# Patient Record
Sex: Female | Born: 1940
Health system: Southern US, Community
[De-identification: ages and names within clinical notes are randomized; demographics above are authoritative.]

## PROBLEM LIST (undated history)

## (undated) DIAGNOSIS — Z8249 Family history of ischemic heart disease and other diseases of the circulatory system: Secondary | ICD-10-CM

## (undated) DIAGNOSIS — R350 Frequency of micturition: Secondary | ICD-10-CM

## (undated) DIAGNOSIS — D649 Anemia, unspecified: Secondary | ICD-10-CM

## (undated) DIAGNOSIS — I7300 Raynaud's syndrome without gangrene: Secondary | ICD-10-CM

## (undated) DIAGNOSIS — R232 Flushing: Secondary | ICD-10-CM

## (undated) DIAGNOSIS — I340 Nonrheumatic mitral (valve) insufficiency: Secondary | ICD-10-CM

## (undated) DIAGNOSIS — Z923 Personal history of irradiation: Secondary | ICD-10-CM

## (undated) DIAGNOSIS — R634 Abnormal weight loss: Secondary | ICD-10-CM

## (undated) DIAGNOSIS — E785 Hyperlipidemia, unspecified: Secondary | ICD-10-CM

## (undated) DIAGNOSIS — N1831 Chronic kidney disease, stage 3a: Secondary | ICD-10-CM

## (undated) DIAGNOSIS — C50919 Malignant neoplasm of unspecified site of unspecified female breast: Secondary | ICD-10-CM

## (undated) DIAGNOSIS — Z460 Encounter for fitting and adjustment of spectacles and contact lenses: Secondary | ICD-10-CM

## (undated) DIAGNOSIS — Z853 Personal history of malignant neoplasm of breast: Principal | ICD-10-CM

## (undated) DIAGNOSIS — I2510 Atherosclerotic heart disease of native coronary artery without angina pectoris: Secondary | ICD-10-CM

## (undated) DIAGNOSIS — Z6827 Body mass index (BMI) 27.0-27.9, adult: Secondary | ICD-10-CM

## (undated) DIAGNOSIS — Z780 Asymptomatic menopausal state: Secondary | ICD-10-CM

## (undated) DIAGNOSIS — R002 Palpitations: Secondary | ICD-10-CM

## (undated) DIAGNOSIS — I10 Essential (primary) hypertension: Secondary | ICD-10-CM

## (undated) DIAGNOSIS — M1990 Unspecified osteoarthritis, unspecified site: Secondary | ICD-10-CM

## (undated) DIAGNOSIS — Z9221 Personal history of antineoplastic chemotherapy: Secondary | ICD-10-CM

## (undated) DIAGNOSIS — D571 Sickle-cell disease without crisis: Secondary | ICD-10-CM

## (undated) DIAGNOSIS — M79673 Pain in unspecified foot: Secondary | ICD-10-CM

## (undated) DIAGNOSIS — E049 Nontoxic goiter, unspecified: Secondary | ICD-10-CM

## (undated) DIAGNOSIS — Z972 Presence of dental prosthetic device (complete) (partial): Secondary | ICD-10-CM

## (undated) DIAGNOSIS — N189 Chronic kidney disease, unspecified: Secondary | ICD-10-CM

## (undated) DIAGNOSIS — I499 Cardiac arrhythmia, unspecified: Secondary | ICD-10-CM

## (undated) DIAGNOSIS — C801 Malignant (primary) neoplasm, unspecified: Secondary | ICD-10-CM

## (undated) DIAGNOSIS — R109 Unspecified abdominal pain: Secondary | ICD-10-CM

## (undated) HISTORY — DX: Malignant (primary) neoplasm, unspecified: C80.1

## (undated) HISTORY — DX: Chronic kidney disease, unspecified: N18.9

## (undated) HISTORY — DX: Family history of ischemic heart disease and other diseases of the circulatory system: Z82.49

## (undated) HISTORY — DX: Atherosclerotic heart disease of native coronary artery without angina pectoris: I25.10

## (undated) HISTORY — DX: Flushing: R23.2

## (undated) HISTORY — DX: Asymptomatic menopausal state: Z78.0

## (undated) HISTORY — DX: Body mass index (BMI) 27.0-27.9, adult: Z68.27

## (undated) HISTORY — DX: Chronic kidney disease, stage 3a: N18.31

## (undated) HISTORY — DX: Personal history of malignant neoplasm of breast: Z85.3

## (undated) HISTORY — DX: Unspecified osteoarthritis, unspecified site: M19.90

## (undated) HISTORY — DX: Personal history of irradiation: Z92.3

## (undated) HISTORY — DX: Sickle-cell disease without crisis: D57.1

## (undated) HISTORY — DX: Personal history of antineoplastic chemotherapy: Z92.21

## (undated) HISTORY — DX: Abnormal weight loss: R63.4

## (undated) HISTORY — DX: Hyperlipidemia, unspecified: E78.5

## (undated) HISTORY — PX: CHOLECYSTECTOMY: SHX55

## (undated) HISTORY — DX: Malignant neoplasm of unspecified site of unspecified female breast: C50.919

## (undated) HISTORY — DX: Cardiac arrhythmia, unspecified: I49.9

## (undated) HISTORY — DX: Palpitations: R00.2

## (undated) HISTORY — PX: TONSILLECTOMY: SUR1361

## (undated) HISTORY — DX: Pain in unspecified foot: M79.673

## (undated) HISTORY — DX: Nontoxic goiter, unspecified: E04.9

## (undated) HISTORY — DX: Essential (primary) hypertension: I10

## (undated) HISTORY — DX: Nonrheumatic mitral (valve) insufficiency: I34.0

## (undated) HISTORY — DX: Unspecified abdominal pain: R10.9

## (undated) HISTORY — DX: Anemia, unspecified: D64.9

## (undated) HISTORY — PX: COLONOSCOPY: SHX174

---

## 1998-05-08 ENCOUNTER — Other Ambulatory Visit: Admission: RE | Admit: 1998-05-08 | Discharge: 1998-05-08 | Payer: Self-pay | Admitting: Obstetrics & Gynecology

## 1999-05-16 ENCOUNTER — Other Ambulatory Visit: Admission: RE | Admit: 1999-05-16 | Discharge: 1999-05-16 | Payer: Self-pay | Admitting: Obstetrics & Gynecology

## 1999-07-25 ENCOUNTER — Other Ambulatory Visit: Admission: RE | Admit: 1999-07-25 | Discharge: 1999-07-25 | Payer: Self-pay | Admitting: Endocrinology

## 1999-09-17 DIAGNOSIS — Z923 Personal history of irradiation: Secondary | ICD-10-CM

## 1999-09-17 DIAGNOSIS — Z9221 Personal history of antineoplastic chemotherapy: Secondary | ICD-10-CM

## 1999-09-17 HISTORY — DX: Personal history of irradiation: Z92.3

## 1999-09-17 HISTORY — DX: Personal history of antineoplastic chemotherapy: Z92.21

## 1999-09-17 HISTORY — PX: BREAST LUMPECTOMY: SHX2

## 2000-01-11 ENCOUNTER — Ambulatory Visit (HOSPITAL_COMMUNITY): Admission: RE | Admit: 2000-01-11 | Discharge: 2000-01-11 | Payer: Self-pay | Admitting: Endocrinology

## 2000-01-11 ENCOUNTER — Encounter: Payer: Self-pay | Admitting: Endocrinology

## 2000-05-30 ENCOUNTER — Other Ambulatory Visit: Admission: RE | Admit: 2000-05-30 | Discharge: 2000-05-30 | Payer: Self-pay | Admitting: Obstetrics & Gynecology

## 2000-07-16 HISTORY — PX: BREAST SURGERY: SHX581

## 2001-06-24 ENCOUNTER — Other Ambulatory Visit: Admission: RE | Admit: 2001-06-24 | Discharge: 2001-06-24 | Payer: Self-pay | Admitting: Obstetrics & Gynecology

## 2005-07-26 ENCOUNTER — Other Ambulatory Visit: Admission: RE | Admit: 2005-07-26 | Discharge: 2005-07-26 | Payer: Self-pay | Admitting: Obstetrics & Gynecology

## 2005-11-10 ENCOUNTER — Emergency Department (HOSPITAL_COMMUNITY): Admission: EM | Admit: 2005-11-10 | Discharge: 2005-11-10 | Payer: Self-pay | Admitting: Emergency Medicine

## 2007-09-29 ENCOUNTER — Ambulatory Visit (HOSPITAL_COMMUNITY): Admission: RE | Admit: 2007-09-29 | Discharge: 2007-09-29 | Payer: Self-pay | Admitting: Internal Medicine

## 2009-11-14 ENCOUNTER — Encounter: Admission: RE | Admit: 2009-11-14 | Discharge: 2009-11-14 | Payer: Self-pay | Admitting: General Surgery

## 2009-12-11 ENCOUNTER — Ambulatory Visit (HOSPITAL_COMMUNITY): Admission: RE | Admit: 2009-12-11 | Discharge: 2009-12-12 | Payer: Self-pay | Admitting: General Surgery

## 2009-12-11 ENCOUNTER — Encounter (INDEPENDENT_AMBULATORY_CARE_PROVIDER_SITE_OTHER): Payer: Self-pay | Admitting: General Surgery

## 2009-12-11 HISTORY — PX: MASTECTOMY: SHX3

## 2009-12-15 ENCOUNTER — Ambulatory Visit: Payer: Self-pay | Admitting: Oncology

## 2010-01-17 ENCOUNTER — Encounter: Admission: RE | Admit: 2010-01-17 | Discharge: 2010-02-20 | Payer: Self-pay | Admitting: General Surgery

## 2010-06-14 ENCOUNTER — Ambulatory Visit: Payer: Self-pay | Admitting: Oncology

## 2010-06-18 LAB — CBC WITH DIFFERENTIAL/PLATELET
BASO%: 0.3 % (ref 0.0–2.0)
Basophils Absolute: 0 10*3/uL (ref 0.0–0.1)
EOS%: 1.6 % (ref 0.0–7.0)
Eosinophils Absolute: 0.1 10*3/uL (ref 0.0–0.5)
HCT: 37.9 % (ref 34.8–46.6)
HGB: 12.9 g/dL (ref 11.6–15.9)
LYMPH%: 25.2 % (ref 14.0–49.7)
MCH: 28.4 pg (ref 25.1–34.0)
MCHC: 34.1 g/dL (ref 31.5–36.0)
MCV: 83.4 fL (ref 79.5–101.0)
MONO#: 0.5 10*3/uL (ref 0.1–0.9)
MONO%: 6.7 % (ref 0.0–14.0)
NEUT#: 4.7 10*3/uL (ref 1.5–6.5)
NEUT%: 66.2 % (ref 38.4–76.8)
Platelets: 222 10*3/uL (ref 145–400)
RBC: 4.55 10*6/uL (ref 3.70–5.45)
RDW: 15 % — ABNORMAL HIGH (ref 11.2–14.5)
WBC: 7 10*3/uL (ref 3.9–10.3)
lymph#: 1.8 10*3/uL (ref 0.9–3.3)

## 2010-06-18 LAB — COMPREHENSIVE METABOLIC PANEL
ALT: 15 U/L (ref 0–35)
AST: 18 U/L (ref 0–37)
Albumin: 4.7 g/dL (ref 3.5–5.2)
Alkaline Phosphatase: 75 U/L (ref 39–117)
BUN: 15 mg/dL (ref 6–23)
CO2: 29 mEq/L (ref 19–32)
Calcium: 10 mg/dL (ref 8.4–10.5)
Chloride: 107 mEq/L (ref 96–112)
Creatinine, Ser: 1.08 mg/dL (ref 0.40–1.20)
Glucose, Bld: 83 mg/dL (ref 70–99)
Potassium: 4 mEq/L (ref 3.5–5.3)
Sodium: 145 mEq/L (ref 135–145)
Total Bilirubin: 0.5 mg/dL (ref 0.3–1.2)
Total Protein: 7.1 g/dL (ref 6.0–8.3)

## 2010-06-18 LAB — LACTATE DEHYDROGENASE: LDH: 174 U/L (ref 94–250)

## 2010-06-18 LAB — CANCER ANTIGEN 27.29: CA 27.29: 43 U/mL — ABNORMAL HIGH (ref 0–39)

## 2010-10-06 ENCOUNTER — Other Ambulatory Visit: Payer: Self-pay | Admitting: Oncology

## 2010-10-06 DIAGNOSIS — Z0000 Encounter for general adult medical examination without abnormal findings: Secondary | ICD-10-CM

## 2010-10-07 ENCOUNTER — Encounter: Payer: Self-pay | Admitting: Internal Medicine

## 2010-10-17 ENCOUNTER — Ambulatory Visit
Admission: RE | Admit: 2010-10-17 | Discharge: 2010-10-17 | Disposition: A | Discharge status: Home / Self Care | Payer: Medicare Other | Source: Ambulatory Visit | Attending: Oncology | Admitting: Oncology

## 2010-10-17 DIAGNOSIS — Z0000 Encounter for general adult medical examination without abnormal findings: Secondary | ICD-10-CM

## 2010-12-10 LAB — CBC
Platelets: 232 10*3/uL (ref 150–400)
RDW: 13.9 % (ref 11.5–15.5)
WBC: 5.9 10*3/uL (ref 4.0–10.5)

## 2010-12-10 LAB — COMPREHENSIVE METABOLIC PANEL
ALT: 20 U/L (ref 0–35)
AST: 24 U/L (ref 0–37)
Albumin: 4.3 g/dL (ref 3.5–5.2)
Alkaline Phosphatase: 72 U/L (ref 39–117)
BUN: 8 mg/dL (ref 6–23)
Chloride: 104 mEq/L (ref 96–112)
GFR calc Af Amer: >60 mL/min (ref >60)
Potassium: 3.5 mEq/L (ref 3.5–5.1)
Sodium: 142 mEq/L (ref 135–145)
Total Bilirubin: 0.4 mg/dL (ref 0.3–1.2)
Total Protein: 7.5 g/dL (ref 6.0–8.3)

## 2010-12-10 LAB — DIFFERENTIAL
Basophils Absolute: 0 10*3/uL (ref 0.0–0.1)
Basophils Relative: 0 % (ref 0–1)
Eosinophils Absolute: 0.1 10*3/uL (ref 0.0–0.7)
Monocytes Absolute: 0.3 10*3/uL (ref 0.1–1.0)
Monocytes Relative: 5 % (ref 3–12)
Neutrophils Relative %: 62 % (ref 43–77)

## 2010-12-10 LAB — URINALYSIS, ROUTINE W REFLEX MICROSCOPIC
Glucose, UA: NEGATIVE mg/dL
Hgb urine dipstick: NEGATIVE
Ketones, ur: NEGATIVE mg/dL
Protein, ur: NEGATIVE mg/dL

## 2010-12-10 LAB — GLUCOSE, CAPILLARY
Glucose-Capillary: 144 mg/dL — ABNORMAL HIGH (ref 70–99)
Glucose-Capillary: 146 mg/dL — ABNORMAL HIGH (ref 70–99)

## 2010-12-10 LAB — CANCER ANTIGEN 27.29: CA 27.29: 43 U/mL — ABNORMAL HIGH (ref 0–39)

## 2011-01-21 ENCOUNTER — Encounter (HOSPITAL_BASED_OUTPATIENT_CLINIC_OR_DEPARTMENT_OTHER): Payer: Medicare Other | Admitting: Oncology

## 2011-01-21 ENCOUNTER — Other Ambulatory Visit: Payer: Self-pay | Admitting: Oncology

## 2011-01-21 DIAGNOSIS — Z170 Estrogen receptor positive status [ER+]: Secondary | ICD-10-CM

## 2011-01-21 DIAGNOSIS — C50419 Malignant neoplasm of upper-outer quadrant of unspecified female breast: Secondary | ICD-10-CM

## 2011-01-21 DIAGNOSIS — D0590 Unspecified type of carcinoma in situ of unspecified breast: Secondary | ICD-10-CM

## 2011-01-21 DIAGNOSIS — Z171 Estrogen receptor negative status [ER-]: Secondary | ICD-10-CM

## 2011-01-21 LAB — CBC WITH DIFFERENTIAL/PLATELET
Basophils Absolute: 0 10*3/uL (ref 0.0–0.1)
EOS%: 1.6 % (ref 0.0–7.0)
Eosinophils Absolute: 0.1 10*3/uL (ref 0.0–0.5)
HGB: 12.3 g/dL (ref 11.6–15.9)
MONO#: 0.4 10*3/uL (ref 0.1–0.9)
NEUT#: 4.6 10*3/uL (ref 1.5–6.5)
RDW: 13.9 % (ref 11.2–14.5)
WBC: 7.4 10*3/uL (ref 3.9–10.3)
lymph#: 2.3 10*3/uL (ref 0.9–3.3)

## 2011-01-22 ENCOUNTER — Encounter (INDEPENDENT_AMBULATORY_CARE_PROVIDER_SITE_OTHER): Payer: Self-pay | Admitting: General Surgery

## 2011-01-22 LAB — COMPREHENSIVE METABOLIC PANEL
ALT: 13 U/L (ref 0–35)
AST: 22 U/L (ref 0–37)
BUN: 12 mg/dL (ref 6–23)
Calcium: 9.7 mg/dL (ref 8.4–10.5)
Chloride: 107 mEq/L (ref 96–112)
Creatinine, Ser: 1.2 mg/dL (ref 0.40–1.20)
Total Bilirubin: 0.5 mg/dL (ref 0.3–1.2)

## 2011-01-22 LAB — LACTATE DEHYDROGENASE: LDH: 171 U/L (ref 94–250)

## 2011-01-22 LAB — VITAMIN D 25 HYDROXY (VIT D DEFICIENCY, FRACTURES): Vit D, 25-Hydroxy: 20 ng/mL — ABNORMAL LOW (ref 30–89)

## 2011-08-26 ENCOUNTER — Telehealth: Payer: Self-pay | Admitting: *Deleted

## 2011-08-26 NOTE — Telephone Encounter (Signed)
patient requested that I mail out her 04-2012 calendar

## 2011-09-19 ENCOUNTER — Other Ambulatory Visit (INDEPENDENT_AMBULATORY_CARE_PROVIDER_SITE_OTHER): Payer: Self-pay | Admitting: General Surgery

## 2011-09-19 DIAGNOSIS — Z853 Personal history of malignant neoplasm of breast: Secondary | ICD-10-CM

## 2011-09-19 DIAGNOSIS — Z9011 Acquired absence of right breast and nipple: Secondary | ICD-10-CM

## 2011-10-22 ENCOUNTER — Ambulatory Visit
Admission: RE | Admit: 2011-10-22 | Discharge: 2011-10-22 | Disposition: A | Discharge status: Home / Self Care | Payer: Medicare Other | Source: Ambulatory Visit | Attending: General Surgery | Admitting: General Surgery

## 2011-10-22 DIAGNOSIS — Z1231 Encounter for screening mammogram for malignant neoplasm of breast: Secondary | ICD-10-CM | POA: Diagnosis not present

## 2011-10-22 DIAGNOSIS — Z853 Personal history of malignant neoplasm of breast: Secondary | ICD-10-CM

## 2011-10-22 DIAGNOSIS — Z9011 Acquired absence of right breast and nipple: Secondary | ICD-10-CM

## 2011-10-28 DIAGNOSIS — E7800 Pure hypercholesterolemia, unspecified: Secondary | ICD-10-CM | POA: Diagnosis not present

## 2011-10-28 DIAGNOSIS — R350 Frequency of micturition: Secondary | ICD-10-CM | POA: Diagnosis not present

## 2011-10-28 DIAGNOSIS — I10 Essential (primary) hypertension: Secondary | ICD-10-CM | POA: Diagnosis not present

## 2011-10-28 DIAGNOSIS — Z79899 Other long term (current) drug therapy: Secondary | ICD-10-CM | POA: Diagnosis not present

## 2011-11-05 ENCOUNTER — Encounter (INDEPENDENT_AMBULATORY_CARE_PROVIDER_SITE_OTHER): Payer: Self-pay | Admitting: General Surgery

## 2011-11-05 ENCOUNTER — Ambulatory Visit (INDEPENDENT_AMBULATORY_CARE_PROVIDER_SITE_OTHER): Payer: Medicare Other | Admitting: General Surgery

## 2011-11-05 VITALS — BP 124/76 | HR 68 | Temp 97.9°F | Resp 16 | Ht 66.0 in | Wt 170.8 lb

## 2011-11-05 DIAGNOSIS — Z853 Personal history of malignant neoplasm of breast: Secondary | ICD-10-CM

## 2011-11-05 NOTE — Progress Notes (Signed)
Patient ID: Catherine Leon, female   DOB: 1941/01/03, 71 y.o.   MRN: [id number]  Chief Complaint  Patient presents with  . Breast Cancer Long Term Follow Up    MGM 10/22/11 at breast center    HPI Catherine Leon is a 71 y.o. female.  She returns for long-term followup regarding her right breast cancer.  This patient underwent right partial mastectomy and axillary lymph node dissection in 2001 for invasive cancer which was receptor positive, pathologic stage T2 N1.  She developed a recurrence in the right breast. On December 11, 2009 she underwent right total mastectomy. She had a stage Tmic, Nx, receptor-negative, HER-2-negative tumor. She had a port placed. Chemotherapy extravasated into the soft tissues in the left infraclavicular area. This required removal of the port and plastic surgical revision.  She has considered reconstructive options of the right breast but has decided not to do that.  She has not noticed any new problems in the right mastectomy wound or the left breast. She still has some numbness in the right axilla.  Left breast mammograms performed September 19, 2011 but normal, category one. HPI  Past Medical History  Diagnosis Date  . Diabetes mellitus   . Cancer   . Hyperlipidemia   . Hypertension   . Constipation     Past Surgical History  Procedure Date  . Mastectomy 12/11/2009    RIGHT BREAST  . Breast surgery 07/16/2000    lumpectomy  . Cholecystectomy 1995 - approximate    Family History  Problem Relation Age of Onset  . Heart disease Mother   . Kidney disease Father   . Diabetes Father   . Cancer Paternal Aunt     colon  . Cancer Paternal Aunt     breast    Social History History  Substance Use Topics  . Smoking status: Former Smoker    Quit date: 11/04/1982  . Smokeless tobacco: Never Used  . Alcohol Use: Yes     WINE. AVERAGE 1 GLASS A MONTH    No Known Allergies  Current Outpatient Prescriptions  Medication Sig Dispense Refill  .  losartan-hydrochlorothiazide (HYZAAR) 100-25 MG per tablet Daily.      [name] oxybutynin (DITROPAN) 5 MG tablet Take 5 mg by mouth 2 (two) times daily.      [name] glipizide-metformin (METAGLIP) 2.5-250 MG per tablet Take 1 tablet by mouth 2 (two) times daily before a meal.        . rosuvastatin (CRESTOR) 5 MG tablet Take 5 mg by mouth daily.          Review of Systems Review of Systems  Constitutional: Negative for fever, chills and unexpected weight change.  HENT: Negative for hearing loss, congestion, sore throat, trouble swallowing and voice change.   Eyes: Negative for visual disturbance.  Respiratory: Negative for cough and wheezing.   Cardiovascular: Negative for chest pain, palpitations and leg swelling.  Gastrointestinal: Negative for nausea, vomiting, abdominal pain, diarrhea, constipation, blood in stool, abdominal distention and anal bleeding.  Genitourinary: Negative for hematuria, vaginal bleeding and difficulty urinating.  Musculoskeletal: Negative for arthralgias.  Skin: Negative for rash and wound.  Neurological: Positive for numbness. Negative for seizures, syncope and headaches.  Hematological: Negative for adenopathy. Does not bruise/bleed easily.  Psychiatric/Behavioral: Negative for confusion.    Blood pressure 124/76, pulse 68, temperature 97.9 F (36.6 C), temperature source Temporal, resp. rate 16, height 5\' 6"  (1.676 m), weight 170 lb 12.8 oz (77.474 kg).  Physical Exam Physical  Exam  Constitutional: She is oriented to person, place, and time. She appears well-developed and well-nourished. No distress.  Neck: Normal range of motion. Neck supple. No JVD present. No tracheal deviation present. No thyromegaly present.  Cardiovascular: Normal rate, regular rhythm and normal heart sounds.   No murmur heard. Pulmonary/Chest: Breath sounds normal. No respiratory distress. She has no wheezes. She has no rales. She exhibits no tenderness.    Musculoskeletal: Normal range of  motion. She exhibits no edema.  Lymphadenopathy:    She has no cervical adenopathy.  Neurological: She is alert and oriented to person, place, and time. Coordination normal.  Skin: Skin is warm and dry. No rash noted. She is not diaphoretic. No erythema. No pallor.  Psychiatric: She has a normal mood and affect. Her behavior is normal. Judgment and thought content normal.    Data Reviewed I have reviewed her mammograms and old records.  Assessment    Recurrent cancer, right breast, upper outer quadrant, pathologic stage TM I SE, in excess, receptor-negative, HER-2 negative. No evidence of recurrence 2 years following salvage right mastectomy.  Past history right partial mastectomy and axillary node dissection in 2001 for ER positive, T2 N1 tumor.  Hypertension  Diabetes  Multi-nodular goiter  S/p porta a cath placement and removal, remote.    Plan    She will see Dr. Drue Second in August of this year.  Return to see me in one year after she gets her annual left breast mammogram.       Angelia Mould. Derrell Lolling, M.D., [facility] General and Minimally invasive Surgery Breast and Colorectal Surgery Office:   [phone] Pager:   [phone]  11/05/2011, 11:47 AM

## 2011-11-05 NOTE — Patient Instructions (Signed)
Your physical exam today reveals no evidence of recurrent cancer on either side. Your left breast mammograms are normal. I think that you were doing well.  Keep your annual appointment with Dr. Drue Second in August.  Return to see me in one year for future annual left breast mammogram.

## 2011-11-13 DIAGNOSIS — E119 Type 2 diabetes mellitus without complications: Secondary | ICD-10-CM | POA: Diagnosis not present

## 2011-11-13 DIAGNOSIS — E049 Nontoxic goiter, unspecified: Secondary | ICD-10-CM | POA: Diagnosis not present

## 2011-11-13 DIAGNOSIS — I10 Essential (primary) hypertension: Secondary | ICD-10-CM | POA: Diagnosis not present

## 2011-11-14 ENCOUNTER — Other Ambulatory Visit: Payer: Self-pay | Admitting: Internal Medicine

## 2011-11-14 DIAGNOSIS — E049 Nontoxic goiter, unspecified: Secondary | ICD-10-CM

## 2011-12-10 ENCOUNTER — Telehealth: Payer: Self-pay | Admitting: Oncology

## 2011-12-10 NOTE — Telephone Encounter (Signed)
S/w the regarding her r/s 04/22/2012 appt to 05/04/2012 due to the md is on pal

## 2011-12-17 ENCOUNTER — Other Ambulatory Visit: Payer: Medicare Other

## 2012-03-10 DIAGNOSIS — E119 Type 2 diabetes mellitus without complications: Secondary | ICD-10-CM | POA: Diagnosis not present

## 2012-03-12 DIAGNOSIS — I10 Essential (primary) hypertension: Secondary | ICD-10-CM | POA: Diagnosis not present

## 2012-03-12 DIAGNOSIS — E119 Type 2 diabetes mellitus without complications: Secondary | ICD-10-CM | POA: Diagnosis not present

## 2012-04-22 ENCOUNTER — Ambulatory Visit: Payer: Medicare Other | Admitting: Oncology

## 2012-04-22 ENCOUNTER — Other Ambulatory Visit: Payer: Medicare Other | Admitting: Lab

## 2012-04-27 ENCOUNTER — Other Ambulatory Visit: Payer: Medicare Other | Admitting: Lab

## 2012-05-04 ENCOUNTER — Other Ambulatory Visit (HOSPITAL_BASED_OUTPATIENT_CLINIC_OR_DEPARTMENT_OTHER): Payer: Medicare Other | Admitting: Lab

## 2012-05-04 ENCOUNTER — Ambulatory Visit (HOSPITAL_BASED_OUTPATIENT_CLINIC_OR_DEPARTMENT_OTHER): Payer: Medicare Other | Admitting: Oncology

## 2012-05-04 ENCOUNTER — Telehealth: Payer: Self-pay | Admitting: *Deleted

## 2012-05-04 ENCOUNTER — Encounter: Payer: Self-pay | Admitting: Oncology

## 2012-05-04 VITALS — BP 130/72 | HR 80 | Temp 98.0°F | Resp 18 | Ht 66.0 in | Wt 167.4 lb

## 2012-05-04 DIAGNOSIS — Z171 Estrogen receptor negative status [ER-]: Secondary | ICD-10-CM

## 2012-05-04 DIAGNOSIS — D0590 Unspecified type of carcinoma in situ of unspecified breast: Secondary | ICD-10-CM | POA: Diagnosis not present

## 2012-05-04 DIAGNOSIS — C50919 Malignant neoplasm of unspecified site of unspecified female breast: Secondary | ICD-10-CM

## 2012-05-04 DIAGNOSIS — Z853 Personal history of malignant neoplasm of breast: Secondary | ICD-10-CM | POA: Insufficient documentation

## 2012-05-04 DIAGNOSIS — Z170 Estrogen receptor positive status [ER+]: Secondary | ICD-10-CM | POA: Diagnosis not present

## 2012-05-04 DIAGNOSIS — C50419 Malignant neoplasm of upper-outer quadrant of unspecified female breast: Secondary | ICD-10-CM | POA: Diagnosis not present

## 2012-05-04 HISTORY — DX: Personal history of malignant neoplasm of breast: Z85.3

## 2012-05-04 LAB — CBC WITH DIFFERENTIAL/PLATELET
BASO%: 0.2 % (ref 0.0–2.0)
Eosinophils Absolute: 0.1 10*3/uL (ref 0.0–0.5)
HCT: 36.1 % (ref 34.8–46.6)
LYMPH%: 24.9 % (ref 14.0–49.7)
MCHC: 33.6 g/dL (ref 31.5–36.0)
MONO#: 0.4 10*3/uL (ref 0.1–0.9)
NEUT#: 4.9 10*3/uL (ref 1.5–6.5)
Platelets: 212 10*3/uL (ref 145–400)
RBC: 4.29 10*6/uL (ref 3.70–5.45)
WBC: 7.2 10*3/uL (ref 3.9–10.3)
lymph#: 1.8 10*3/uL (ref 0.9–3.3)

## 2012-05-04 LAB — COMPREHENSIVE METABOLIC PANEL
ALT: 13 U/L (ref 0–35)
Albumin: 4.4 g/dL (ref 3.5–5.2)
CO2: 30 mEq/L (ref 19–32)
Calcium: 9.6 mg/dL (ref 8.4–10.5)
Chloride: 105 mEq/L (ref 96–112)
Glucose, Bld: 236 mg/dL — ABNORMAL HIGH (ref 70–99)
Potassium: 3.9 mEq/L (ref 3.5–5.3)
Sodium: 143 mEq/L (ref 135–145)
Total Protein: 7 g/dL (ref 6.0–8.3)

## 2012-05-04 NOTE — Patient Instructions (Addendum)
Peridin C over the counter for hot flashes

## 2012-05-04 NOTE — Progress Notes (Signed)
OFFICE PROGRESS NOTE  CC  Londell Moh, MD [address] [place] [address]  DIAGNOSIS: 71 year old female with ductal carcinoma in situ with less than 0.2 cm component of microinvasive ductal carcinoma of the right breast status post right mastectomy on 12/11/2009. Tumor was ER negative PR negative HER-2/neu negative.  PRIOR THERAPY:  #1 patient had stage IIB invasive ductal carcinoma of the right breast that was ER PR positive HER-2/neu negative. At that time she underwent a partial mastectomy with axillary lymph node dissection followed by 6 cycles of CMF followed by radiation. She then received 10 years of adjuvant antiestrogen therapy consisting of tamoxifen followed by Arimidex.  #2  In 12/11/2009 patient was found to have a DCIS of the right breast with a 0.2 cm microinvasive ductal carcinoma this was triple negative low grade. She underwent a mastectomy and has been on observation now.  CURRENT THERAPY:Observation  INTERVAL HISTORY: Catherine Leon 71 y.o. female returns for Followup visit at one year overall she is doing well she is without any complaints she denies any nausea vomiting fevers chills night sweats. Her mammograms are up to date she had her last mammogram in January 2013. Remainder of the 10 point review of systems is negative.  MEDICAL HISTORY: Past Medical History  Diagnosis Date  . Diabetes mellitus   . Cancer   . Hyperlipidemia   . Hypertension   . Constipation   . Hx of breast cancer 05/04/2012    ALLERGIES:   has no known allergies.  MEDICATIONS:  Current Outpatient Prescriptions  Medication Sig Dispense Refill  . glipizide-metformin (METAGLIP) 2.5-250 MG per tablet Take 1 tablet by mouth 2 (two) times daily before a meal.       . losartan-hydrochlorothiazide (HYZAAR) 100-25 MG per tablet Daily.      [name] oxybutynin (DITROPAN) 5 MG tablet Take 5 mg by mouth 2 (two) times daily.      . rosuvastatin (CRESTOR) 5 MG tablet Take  5 mg by mouth daily.          SURGICAL HISTORY:  Past Surgical History  Procedure Date  . Mastectomy 12/11/2009    RIGHT BREAST  . Breast surgery 07/16/2000    lumpectomy  . Cholecystectomy 1995 - approximate    REVIEW OF SYSTEMS:  Pertinent items are noted in HPI.   PHYSICAL EXAMINATION: General appearance: alert and cooperative Lymph nodes: Cervical, supraclavicular, and axillary nodes normal. Resp: clear to auscultation bilaterally Back: symmetric, no curvature. ROM normal. No CVA tenderness. Cardio: regular rate and rhythm, S1, S2 normal, no murmur, click, rub or gallop GI: soft, non-tender; bowel sounds normal; no masses,  no organomegaly Extremities: extremities normal, atraumatic, no cyanosis or edema Neurologic: Grossly normal Left breast no masses nipple discharge or skin changes right mastectomy incision scar is well-healed no evidence of local recurrence.  ECOG PERFORMANCE STATUS: 0 - Asymptomatic  Blood pressure 130/72, pulse 80, temperature 98 F (36.7 C), temperature source Oral, resp. rate 18, height 5\' 6"  (1.676 m), weight 167 lb 6.4 oz (75.932 kg).  LABORATORY DATA: Lab Results  Component Value Date   WBC 7.2 05/04/2012   HGB 12.1 05/04/2012   HCT 36.1 05/04/2012   MCV 84.3 05/04/2012   PLT 212 05/04/2012      Chemistry         RADIOGRAPHIC STUDIES:  No results found.  ASSESSMENT: 71 year old female with prior history of stage IIB right breast cancer treated with chemotherapy and antiestrogen therapy. Subsequently patient developed a  triple-negative breast cancer in the ipsilateral breast she therefore underwent a mastectomy on 12/11/2009. She is without any evidence of local recurrence or any contralateral breast cancer. She is followed very closely by Dr. Claud Kelp.   PLAN: Patient will be seen by me on as needed basis however I did set her up to see me in about 2 years time. She is seeing Dr. Derrell Lolling on a yearly basis as well.   All questions  were answered. The patient knows to call the clinic with any problems, questions or concerns. We can certainly see the patient much sooner if necessary.  I spent 20 minutes counseling the patient face to face. The total time spent in the appointment was 30 minutes.    Drue Second, MD Medical/Oncology [facility] [phone] (beeper) [phone] (Office)  05/04/2012, 2:49 PM

## 2012-05-04 NOTE — Telephone Encounter (Signed)
Per orders physicians schedule is not open for 2015 yet printed out orders

## 2012-07-09 DIAGNOSIS — E119 Type 2 diabetes mellitus without complications: Secondary | ICD-10-CM | POA: Diagnosis not present

## 2012-07-09 DIAGNOSIS — Z0000 Encounter for general adult medical examination without abnormal findings: Secondary | ICD-10-CM | POA: Diagnosis not present

## 2012-07-09 DIAGNOSIS — I10 Essential (primary) hypertension: Secondary | ICD-10-CM | POA: Diagnosis not present

## 2012-07-14 ENCOUNTER — Telehealth (INDEPENDENT_AMBULATORY_CARE_PROVIDER_SITE_OTHER): Payer: Self-pay | Admitting: General Surgery

## 2012-07-14 NOTE — Telephone Encounter (Signed)
Pt calling to request a Rx for replacing her prosthesis at [facility] [place].  Script issued for an "athletic/ leisure breast form" and will FAX to [phone] when signed by Dr. Derrell Lolling.

## 2012-08-31 DIAGNOSIS — B029 Zoster without complications: Secondary | ICD-10-CM | POA: Diagnosis not present

## 2012-10-01 ENCOUNTER — Encounter (INDEPENDENT_AMBULATORY_CARE_PROVIDER_SITE_OTHER): Payer: Self-pay | Admitting: General Surgery

## 2012-10-01 ENCOUNTER — Other Ambulatory Visit (INDEPENDENT_AMBULATORY_CARE_PROVIDER_SITE_OTHER): Payer: Self-pay | Admitting: General Surgery

## 2012-10-01 DIAGNOSIS — Z9011 Acquired absence of right breast and nipple: Secondary | ICD-10-CM

## 2012-10-01 DIAGNOSIS — Z1231 Encounter for screening mammogram for malignant neoplasm of breast: Secondary | ICD-10-CM

## 2012-10-01 NOTE — Progress Notes (Signed)
Faxed on 09/30/12 signed order by Dr. Derrell Lolling for prosthesis replacement to the attention of [facility]. Fax #  [phone]. Confirmation received.

## 2012-10-29 ENCOUNTER — Ambulatory Visit: Payer: Medicare Other

## 2012-11-02 ENCOUNTER — Ambulatory Visit
Admission: RE | Admit: 2012-11-02 | Discharge: 2012-11-02 | Disposition: A | Discharge status: Home / Self Care | Payer: Medicare Other | Source: Ambulatory Visit | Attending: General Surgery | Admitting: General Surgery

## 2012-11-02 DIAGNOSIS — Z1231 Encounter for screening mammogram for malignant neoplasm of breast: Secondary | ICD-10-CM

## 2012-11-02 DIAGNOSIS — Z9011 Acquired absence of right breast and nipple: Secondary | ICD-10-CM

## 2012-11-03 ENCOUNTER — Other Ambulatory Visit (INDEPENDENT_AMBULATORY_CARE_PROVIDER_SITE_OTHER): Payer: Self-pay | Admitting: General Surgery

## 2012-11-03 DIAGNOSIS — R928 Other abnormal and inconclusive findings on diagnostic imaging of breast: Secondary | ICD-10-CM

## 2012-11-05 ENCOUNTER — Encounter (INDEPENDENT_AMBULATORY_CARE_PROVIDER_SITE_OTHER): Payer: Self-pay | Admitting: General Surgery

## 2012-11-05 ENCOUNTER — Encounter (INDEPENDENT_AMBULATORY_CARE_PROVIDER_SITE_OTHER): Payer: Self-pay

## 2012-11-05 NOTE — Progress Notes (Signed)
Faxed signed authorization by Dr. Derrell Lolling for additional diagnostic imaging due to abnormal mammogram to the attention of the breast center fax # [phone]. Confirmation received. Signed authorization sent to medical records to be scanned into the chart.

## 2012-11-12 ENCOUNTER — Ambulatory Visit
Admission: RE | Admit: 2012-11-12 | Discharge: 2012-11-12 | Disposition: A | Discharge status: Home / Self Care | Payer: Medicare Other | Source: Ambulatory Visit | Attending: General Surgery | Admitting: General Surgery

## 2012-11-12 ENCOUNTER — Other Ambulatory Visit (INDEPENDENT_AMBULATORY_CARE_PROVIDER_SITE_OTHER): Payer: Self-pay | Admitting: General Surgery

## 2012-11-12 ENCOUNTER — Ambulatory Visit (INDEPENDENT_AMBULATORY_CARE_PROVIDER_SITE_OTHER): Payer: Medicare Other | Admitting: General Surgery

## 2012-11-12 ENCOUNTER — Encounter (INDEPENDENT_AMBULATORY_CARE_PROVIDER_SITE_OTHER): Payer: Self-pay | Admitting: General Surgery

## 2012-11-12 VITALS — BP 144/82 | HR 74 | Temp 97.8°F | Resp 18 | Ht 65.5 in | Wt 169.5 lb

## 2012-11-12 DIAGNOSIS — Z853 Personal history of malignant neoplasm of breast: Secondary | ICD-10-CM

## 2012-11-12 DIAGNOSIS — R928 Other abnormal and inconclusive findings on diagnostic imaging of breast: Secondary | ICD-10-CM | POA: Diagnosis not present

## 2012-11-12 NOTE — Patient Instructions (Signed)
Examination of the right mastectomy wound, left breast, and lymph nodes is normal. There is no evidence of cancer by physical exam.  Your recent mammogram suggests some microcalcifications in the left breast, and these need further x-rays to clarify whether there is a concern or not. Be sure to get those x-rays done later today.  If the x-rays are normal, then return to see Dr. Derrell Lolling in one year after you get your annual mammograms.  If further evaluation and biopsy is indicated, I suggest that  You follow the radiologist's recommendation.

## 2012-11-12 NOTE — Progress Notes (Signed)
Patient ID: Catherine Leon, female   DOB: [birth year], 72 y.o.   MRN: [id number] History: This patient returns for long-term followup regarding her right breast cancer. In 2001 she underwent right partial mastectomy and axillary lymph node dissection. She had invasive carcinoma, receptor positive, pathologic stage T2, N1. On 12/11/2009 she underwent right total mastectomy for a recurrent cancer in the right breast. Final pathology was Tmic,, NX, receptor-negative, HER-2-negative. A port was placed. She received chemotherapy. She had extravasation at the port site and had to have plastic surgical reconstruction in the left infraclavicular area. She has had no recurrence to date. She is being followed by Dr. Welton Flakes, and who has now discharged her from her care Mammograms of the left breast on 11/02/2012 showed some microcalcifications and further imaging is planned later today. This has obviously caused some distress to the patient.  Exam: Patient looks well. In no physical distress. Neck reveals no adenopathy or mass or JVD. Lungs clear to auscultation bilaterally Heart regular rate and rhythm no murmur no ectopy Breasts: Right mastectomy wound healed. No ulcers. No nodules. No axillary mass. Good range of motion right shoulder. No arm swelling. Left breast moderately large. Soft. No palpable mass. No skin changes. No axillary adenopathy  Assessment:   1)  Invasive cancer right breast, status post right partial mastectomy and axillary lymph node dissection 2001 and subsequent right total mastectomy for sounds following recurrence in 2011. No evidence of local recurrence right chest wall. 2)  Chemotherapy extravasation, left infra-clavicular area, resulting in skin necrosis and plastic surgical revision. Wound remains healed 3)  Indeterminate calcifications left breast on recent mammograms. Further imaging indicated  Plan: Magnification imaging and possible ultrasound of left breast to be done this  afternoon Image guided biopsy, if indicated will be recommended by radiology If no further adenopathy found, return to see me in one year after annual mammography.   Angelia Mould. Derrell Lolling, M.D., [facility] General and Minimally invasive Surgery Breast and Colorectal Surgery Office:   [phone] Pager:   [phone]

## 2013-04-06 ENCOUNTER — Other Ambulatory Visit (INDEPENDENT_AMBULATORY_CARE_PROVIDER_SITE_OTHER): Payer: Self-pay | Admitting: General Surgery

## 2013-04-06 DIAGNOSIS — R921 Mammographic calcification found on diagnostic imaging of breast: Secondary | ICD-10-CM

## 2013-05-13 ENCOUNTER — Ambulatory Visit
Admission: RE | Admit: 2013-05-13 | Discharge: 2013-05-13 | Disposition: A | Discharge status: Home / Self Care | Payer: Medicare Other | Source: Ambulatory Visit | Attending: General Surgery | Admitting: General Surgery

## 2013-05-13 DIAGNOSIS — R921 Mammographic calcification found on diagnostic imaging of breast: Secondary | ICD-10-CM

## 2013-05-13 DIAGNOSIS — R928 Other abnormal and inconclusive findings on diagnostic imaging of breast: Secondary | ICD-10-CM | POA: Diagnosis not present

## 2013-06-01 DIAGNOSIS — L0390 Cellulitis, unspecified: Secondary | ICD-10-CM | POA: Diagnosis not present

## 2013-06-01 DIAGNOSIS — L0291 Cutaneous abscess, unspecified: Secondary | ICD-10-CM | POA: Diagnosis not present

## 2013-06-02 ENCOUNTER — Telehealth (INDEPENDENT_AMBULATORY_CARE_PROVIDER_SITE_OTHER): Payer: Self-pay

## 2013-06-02 NOTE — Telephone Encounter (Signed)
Patient has a rt. Neck mass with S/P seb. Cyst per Dr. Renne Crigler patient is needing to be seen BY Dr.Ingram please call Dennie Bible [phone]

## 2013-06-02 NOTE — Telephone Encounter (Signed)
I am happy to see her. As you can see, she is an old patient of mine.  hmi

## 2013-06-10 ENCOUNTER — Ambulatory Visit (INDEPENDENT_AMBULATORY_CARE_PROVIDER_SITE_OTHER): Payer: Medicare Other | Admitting: General Surgery

## 2013-06-10 ENCOUNTER — Encounter (HOSPITAL_BASED_OUTPATIENT_CLINIC_OR_DEPARTMENT_OTHER): Payer: Self-pay | Admitting: *Deleted

## 2013-06-10 ENCOUNTER — Encounter (INDEPENDENT_AMBULATORY_CARE_PROVIDER_SITE_OTHER): Payer: Self-pay | Admitting: General Surgery

## 2013-06-10 VITALS — BP 120/78 | HR 72 | Temp 97.5°F | Resp 14 | Ht 65.0 in | Wt 170.2 lb

## 2013-06-10 DIAGNOSIS — D234 Other benign neoplasm of skin of scalp and neck: Secondary | ICD-10-CM | POA: Diagnosis not present

## 2013-06-10 DIAGNOSIS — Z853 Personal history of malignant neoplasm of breast: Secondary | ICD-10-CM

## 2013-06-10 DIAGNOSIS — Z9889 Other specified postprocedural states: Secondary | ICD-10-CM

## 2013-06-10 NOTE — Progress Notes (Signed)
To come in for bmet-will see if dr Renne Crigler has ekg-if not will get that On antibiotic per dr Derrell Lolling-

## 2013-06-10 NOTE — Progress Notes (Addendum)
Patient ID: Catherine Leon, female   DOB: 01/26/1941, 72 y.o.   MRN: [id number]  Chief Complaint  Patient presents with  . New Evaluation    eval rt neck mass/seb cyst?    HPI Catherine Leon is a 72 y.o. female.  She is referred back to me today by Dr. Merri Brunette  for evaluation and management of a recurrent infected epidermoid cyst of the right posterior neck.  The patient states that back in the 1990s, one of my partners (Dr. Orpah Greek) excised an  infected cyst from the neck. She states that she was told that this would recur. She says that there has been a lump there for some time which has been slowly enlarging. She states that it became more swollen recently  and she put soaks on it and it started draining. She was seen a doctor in the office recently and noted to have an infected draining cyst and she was referred to me.  Comorbidities include history of breast cancer, diabetes non-insulin-dependent, hypertension, hyperlipidemia.  I last saw her on 11/12/2012 for followup of her right breast cancer. Please see my detailed notes at that time. She is due to followup with me in February 2015. She declines breast exam today.  HPI  Past Medical History  Diagnosis Date  . Diabetes mellitus   . Cancer   . Hyperlipidemia   . Hypertension   . Constipation   . Hx of breast cancer 05/04/2012    Past Surgical History  Procedure Laterality Date  . Mastectomy  12/11/2009    RIGHT BREAST  . Breast surgery  07/16/2000    lumpectomy  . Cholecystectomy  1995 - approximate    Family History  Problem Relation Age of Onset  . Heart disease Mother   . Kidney disease Father   . Diabetes Father   . Cancer Paternal Aunt     colon  . Cancer Paternal Aunt     breast    Social History History  Substance Use Topics  . Smoking status: Former Smoker    Quit date: 11/04/1982  . Smokeless tobacco: Never Used  . Alcohol Use: Yes     Comment: WINE. AVERAGE 1 GLASS A MONTH    No Known  Allergies  Current Outpatient Prescriptions  Medication Sig Dispense Refill  . glipizide-metformin (METAGLIP) 2.5-250 MG per tablet Take 1 tablet by mouth 2 (two) times daily before a meal.       . losartan-hydrochlorothiazide (HYZAAR) 100-25 MG per tablet Daily.      [name] oxybutynin (DITROPAN) 5 MG tablet Take 5 mg by mouth 2 (two) times daily.      . rosuvastatin (CRESTOR) 5 MG tablet Take 5 mg by mouth daily.         No current facility-administered medications for this visit.    Review of Systems Review of Systems  Constitutional: Negative for fever, chills and unexpected weight change.  HENT: Positive for neck pain. Negative for hearing loss, congestion, sore throat, trouble swallowing and voice change.   Eyes: Negative for visual disturbance.  Respiratory: Negative for cough and wheezing.   Cardiovascular: Negative for chest pain, palpitations and leg swelling.  Gastrointestinal: Negative for nausea, vomiting, abdominal pain, diarrhea, constipation, blood in stool, abdominal distention and anal bleeding.  Genitourinary: Negative for hematuria, vaginal bleeding and difficulty urinating.  Musculoskeletal: Negative for arthralgias.  Skin: Negative for rash and wound.  Neurological: Negative for seizures, syncope and headaches.  Hematological: Negative for adenopathy. Does not  bruise/bleed easily.  Psychiatric/Behavioral: Negative for confusion.    Blood pressure 120/78, pulse 72, temperature 97.5 F (36.4 C), temperature source Temporal, resp. rate 14, height 5\' 5"  (1.651 m), weight 170 lb 3.2 oz (77.202 kg).  Physical Exam Physical Exam  Constitutional: She is oriented to person, place, and time. She appears well-developed and well-nourished. No distress.  HENT:  Head: Normocephalic and atraumatic.  Nose: Nose normal.  Mouth/Throat: No oropharyngeal exudate.  Eyes: Conjunctivae and EOM are normal. Pupils are equal, round, and reactive to light. Left eye exhibits no discharge.  No scleral icterus.  Neck: Neck supple. No JVD present. No tracheal deviation present. No thyromegaly present.  In the right posterior neck, just below the hairline, there's a 2.5 cm mass with a central draining area. Physical characteristics are that of a infected epidermoid cyst. There is a significant solid component to this. This does not appear malignant. There is no adenopathy in the neck.  Cardiovascular: Normal rate, regular rhythm, normal heart sounds and intact distal pulses.   No murmur heard. Pulmonary/Chest: Effort normal and breath sounds normal. No respiratory distress. She has no wheezes. She has no rales. She exhibits no tenderness.  Abdominal: Soft. Bowel sounds are normal.  Musculoskeletal: She exhibits no edema and no tenderness.  Lymphadenopathy:    She has no cervical adenopathy.  Neurological: She is alert and oriented to person, place, and time. She exhibits normal muscle tone. Coordination normal.  Skin: Skin is warm. No rash noted. She is not diaphoretic. No erythema. No pallor.  Psychiatric: She has a normal mood and affect. Her behavior is normal. Judgment and thought content normal.    Data Reviewed My old records. Dr. Carolee Rota office notes.  Assessment    Recurrent, infected sebaceous cyst right posterior neck. Hopefully this is away from the spinal accessory nerve, but the course of that nerve is variable and we have to be careful to avoid injuring that.  History right breast cancer, status post right partial mastectomy and axillary lymph node dissection 2001, and subsequent right total mastectomy for recurrent 2011.  Non-insulin-dependent diabetes mellitus  Hypertension  Hyperlipidemia     Plan    Doxycycline 100 mg by mouth twice a day, prescription for 15 tablets given  Scheduled for excision of right neck mass under general anesthesia. I explained that because of the size of this mass and the risk of injury to the spinal accessory nerve, that I  require this be done under general anesthesia   I discussed the indications, details, techniques, and numerous risk of the surgery with her. She's aware of the risk of recurrent infection, prolonged healing, leaving the wound open, injury to the spinal accessory nerve, minor nerve damage chronic pain or numbness, and other unforeseen problems. She understands the issues well. Her questions were answered. She agrees with this plan.       Angelia Mould. Derrell Lolling, M.D., [facility] General and Minimally invasive Surgery Breast and Colorectal Surgery Office:   [phone] Pager:   [phone]  06/10/2013, 12:18 PM

## 2013-06-10 NOTE — Patient Instructions (Signed)
You have a recurrent, infected epidermoid cyst of the right neck.  You will be scheduled for excision of this cyst under general anesthesia as an outpatient. We will probably need to leave the skin at least partially open.  You will be started on a doxycycline antibiotic prescription today.

## 2013-06-11 ENCOUNTER — Other Ambulatory Visit: Payer: Self-pay

## 2013-06-11 ENCOUNTER — Encounter (HOSPITAL_BASED_OUTPATIENT_CLINIC_OR_DEPARTMENT_OTHER)
Admission: RE | Admit: 2013-06-11 | Discharge: 2013-06-11 | Disposition: A | Discharge status: Home / Self Care | Payer: Medicare Other | Source: Ambulatory Visit | Attending: General Surgery | Admitting: General Surgery

## 2013-06-11 DIAGNOSIS — Z01810 Encounter for preprocedural cardiovascular examination: Secondary | ICD-10-CM | POA: Insufficient documentation

## 2013-06-11 DIAGNOSIS — E119 Type 2 diabetes mellitus without complications: Secondary | ICD-10-CM | POA: Diagnosis not present

## 2013-06-11 DIAGNOSIS — Z853 Personal history of malignant neoplasm of breast: Secondary | ICD-10-CM | POA: Diagnosis not present

## 2013-06-11 DIAGNOSIS — L0889 Other specified local infections of the skin and subcutaneous tissue: Secondary | ICD-10-CM | POA: Diagnosis not present

## 2013-06-11 DIAGNOSIS — E785 Hyperlipidemia, unspecified: Secondary | ICD-10-CM | POA: Diagnosis not present

## 2013-06-11 DIAGNOSIS — I10 Essential (primary) hypertension: Secondary | ICD-10-CM | POA: Diagnosis not present

## 2013-06-11 LAB — COMPREHENSIVE METABOLIC PANEL
Alkaline Phosphatase: 67 U/L (ref 39–117)
BUN: 13 mg/dL (ref 6–23)
CO2: 29 mEq/L (ref 19–32)
Chloride: 100 mEq/L (ref 96–112)
Creatinine, Ser: 1.11 mg/dL — ABNORMAL HIGH (ref 0.50–1.10)
GFR calc Af Amer: 56 mL/min — ABNORMAL LOW (ref >90)
GFR calc non Af Amer: 48 mL/min — ABNORMAL LOW (ref >90)
Glucose, Bld: 171 mg/dL — ABNORMAL HIGH (ref 70–99)
Potassium: 4 mEq/L (ref 3.5–5.1)
Total Bilirubin: 0.4 mg/dL (ref 0.3–1.2)

## 2013-06-11 LAB — CBC WITH DIFFERENTIAL/PLATELET
Basophils Relative: 0 % (ref 0–1)
HCT: 35.6 % — ABNORMAL LOW (ref 36.0–46.0)
Hemoglobin: 12.4 g/dL (ref 12.0–15.0)
Lymphocytes Relative: 36 % (ref 12–46)
Lymphs Abs: 2.4 10*3/uL (ref 0.7–4.0)
MCHC: 34.8 g/dL (ref 30.0–36.0)
Monocytes Absolute: 0.5 10*3/uL (ref 0.1–1.0)
Monocytes Relative: 7 % (ref 3–12)
Neutro Abs: 3.8 10*3/uL (ref 1.7–7.7)
Neutrophils Relative %: 56 % (ref 43–77)
RBC: 4.38 MIL/uL (ref 3.87–5.11)

## 2013-06-13 NOTE — H&P (Signed)
Catherine Leon    MRN:  [id number]   Description: 72 year old female  Provider: Ernestene Mention, MD  Department: [facility]        Diagnoses    Cyst, dermoid, scalp and neck    -  Primary    216.4    History of removal of neck cyst        V15.29    Hx of breast cancer        V10.3            Current Vitals - Last Recorded    BP Pulse Temp(Src) Resp Ht Wt    120/78 72 97.5 F (36.4 C) (Temporal) 14 5\' 5"  (1.651 m) 170 lb 3.2 oz (77.202 kg)       BMI -  28.32 kg/m2                  History and Physical   Ernestene Mention, MD    Status: Addendum                          HPI Catherine Leon is a 72 y.o. female.  She is referred back to me today by Dr. Merri Brunette for evaluation and management of a recurrent infected epidermoid cyst of the right posterior neck.   The patient states that back in the 1990s, one of my partners (Dr. Orpah Greek) excised an  infected cyst from the neck. She states that she was told that this would recur. She says that there has been a lump there for some time which has been slowly enlarging. She states that it became more swollen recently  and she put soaks on it and it started draining. She was seen a doctor in the office recently and noted to have an infected draining cyst and she was referred to me.   Comorbidities include history of breast cancer, diabetes non-insulin-dependent, hypertension, hyperlipidemia.   I last saw her on 11/12/2012 for followup of her right breast cancer. Please see my detailed notes at that time. She is due to followup with me in February 2015. She declines breast exam today.          Past Medical History   Diagnosis  Date   .  Diabetes mellitus     .  Cancer     .  Hyperlipidemia     .  Hypertension     .  Constipation     .  Hx of breast cancer  05/04/2012         Past Surgical History   Procedure  Laterality  Date   .  Mastectomy    12/11/2009       RIGHT BREAST   .  Breast surgery     07/16/2000       lumpectomy   .  Cholecystectomy    1995 - approximate         Family History   Problem  Relation  Age of Onset   .  Heart disease  Mother     .  Kidney disease  Father     .  Diabetes  Father     .  Cancer  Paternal Aunt         colon   .  Cancer  Paternal Aunt         breast        Social History History   Substance Use  Topics   .  Smoking status:  Former Smoker       Quit date:  11/04/1982   .  Smokeless tobacco:  Never Used   .  Alcohol Use:  Yes         Comment: WINE. AVERAGE 1 GLASS A MONTH        No Known Allergies    Current Outpatient Prescriptions   Medication  Sig  Dispense  Refill   .  glipizide-metformin (METAGLIP) 2.5-250 MG per tablet  Take 1 tablet by mouth 2 (two) times daily before a meal.          .  losartan-hydrochlorothiazide (HYZAAR) 100-25 MG per tablet  Daily.         [name]  oxybutynin (DITROPAN) 5 MG tablet  Take 5 mg by mouth 2 (two) times daily.         .  rosuvastatin (CRESTOR) 5 MG tablet  Take 5 mg by mouth daily.               No current facility-administered medications for this visit.        Review of Systems   Constitutional: Negative for fever, chills and unexpected weight change.  HENT: Positive for neck pain. Negative for hearing loss, congestion, sore throat, trouble swallowing and voice change.   Eyes: Negative for visual disturbance.  Respiratory: Negative for cough and wheezing.   Cardiovascular: Negative for chest pain, palpitations and leg swelling.  Gastrointestinal: Negative for nausea, vomiting, abdominal pain, diarrhea, constipation, blood in stool, abdominal distention and anal bleeding.  Genitourinary: Negative for hematuria, vaginal bleeding and difficulty urinating.  Musculoskeletal: Negative for arthralgias.  Skin: Negative for rash and wound.  Neurological: Negative for seizures, syncope and headaches.  Hematological: Negative for adenopathy. Does not bruise/bleed easily.   Psychiatric/Behavioral: Negative for confusion.      Blood pressure 120/78, pulse 72, temperature 97.5 F (36.4 C), temperature source Temporal, resp. rate 14, height 5\' 5"  (1.651 m), weight 170 lb 3.2 oz (77.202 kg).   Physical Exam   Constitutional: She is oriented to person, place, and time. She appears well-developed and well-nourished. No distress.  HENT:   Head: Normocephalic and atraumatic.   Nose: Nose normal.   Mouth/Throat: No oropharyngeal exudate.  Eyes: Conjunctivae and EOM are normal. Pupils are equal, round, and reactive to light. Left eye exhibits no discharge. No scleral icterus.  Neck: Neck supple. No JVD present. No tracheal deviation present. No thyromegaly present.  In the right posterior neck, just below the hairline, there's a 2.5 cm mass with a central draining area. Physical characteristics are that of a infected epidermoid cyst. There is a significant solid component to this. This does not appear malignant. There is no adenopathy in the neck.  Cardiovascular: Normal rate, regular rhythm, normal heart sounds and intact distal pulses.    No murmur heard. Pulmonary/Chest: Effort normal and breath sounds normal. No respiratory distress. She has no wheezes. She has no rales. She exhibits no tenderness.  Abdominal: Soft. Bowel sounds are normal.  Musculoskeletal: She exhibits no edema and no tenderness.  Lymphadenopathy:    She has no cervical adenopathy.  Neurological: She is alert and oriented to person, place, and time. She exhibits normal muscle tone. Coordination normal.  Skin: Skin is warm. No rash noted. She is not diaphoretic. No erythema. No pallor.  Psychiatric: She has a normal mood and affect. Her behavior is normal. Judgment and thought content normal.      Data  Reviewed My old records. Dr. Carolee Rota office notes.   Assessment    Recurrent, infected sebaceous cyst right posterior neck. Hopefully this is away from the spinal accessory nerve, but  the course of that nerve is variable and we have to be careful to avoid injuring that.   History right breast cancer, status post right partial mastectomy and axillary lymph node dissection 2001, and subsequent right total mastectomy for recurrent 2011.   Non-insulin-dependent diabetes mellitus   Hypertension   Hyperlipidemia      Plan    Doxycycline 100 mg by mouth twice a day, prescription for 15 tablets given   Scheduled for excision of right neck mass under general anesthesia. I explained that because of the size of this mass and the risk of injury to the spinal accessory nerve, that I require this be done under general anesthesia    I discussed the indications, details, techniques, and numerous risk of the surgery with her. She's aware of the risk of recurrent infection, prolonged healing, leaving the wound open, injury to the spinal accessory nerve, minor nerve damage chronic pain or numbness, and other unforeseen problems. She understands the issues well. Her questions were answered. She agrees with this plan.          Angelia Mould. Derrell Lolling, M.D., [facility] General and Minimally invasive Surgery Breast and Colorectal Surgery Office:   [phone] Pager:   [phone]

## 2013-06-15 ENCOUNTER — Encounter (HOSPITAL_BASED_OUTPATIENT_CLINIC_OR_DEPARTMENT_OTHER): Payer: Self-pay | Admitting: *Deleted

## 2013-06-15 ENCOUNTER — Ambulatory Visit (HOSPITAL_BASED_OUTPATIENT_CLINIC_OR_DEPARTMENT_OTHER)
Admission: RE | Admit: 2013-06-15 | Discharge: 2013-06-15 | Disposition: A | Discharge status: Home / Self Care | Payer: Medicare Other | Source: Ambulatory Visit | Attending: General Surgery | Admitting: General Surgery

## 2013-06-15 ENCOUNTER — Ambulatory Visit (HOSPITAL_BASED_OUTPATIENT_CLINIC_OR_DEPARTMENT_OTHER): Payer: Medicare Other | Admitting: *Deleted

## 2013-06-15 ENCOUNTER — Encounter (HOSPITAL_BASED_OUTPATIENT_CLINIC_OR_DEPARTMENT_OTHER)
Admission: RE | Disposition: A | Discharge status: Home / Self Care | Payer: Self-pay | Source: Ambulatory Visit | Attending: General Surgery

## 2013-06-15 DIAGNOSIS — E785 Hyperlipidemia, unspecified: Secondary | ICD-10-CM | POA: Diagnosis not present

## 2013-06-15 DIAGNOSIS — R220 Localized swelling, mass and lump, head: Secondary | ICD-10-CM | POA: Diagnosis not present

## 2013-06-15 DIAGNOSIS — I10 Essential (primary) hypertension: Secondary | ICD-10-CM | POA: Diagnosis not present

## 2013-06-15 DIAGNOSIS — L089 Local infection of the skin and subcutaneous tissue, unspecified: Secondary | ICD-10-CM | POA: Diagnosis not present

## 2013-06-15 DIAGNOSIS — L0889 Other specified local infections of the skin and subcutaneous tissue: Secondary | ICD-10-CM | POA: Insufficient documentation

## 2013-06-15 DIAGNOSIS — L03221 Cellulitis of neck: Secondary | ICD-10-CM

## 2013-06-15 DIAGNOSIS — Z853 Personal history of malignant neoplasm of breast: Secondary | ICD-10-CM | POA: Insufficient documentation

## 2013-06-15 DIAGNOSIS — E119 Type 2 diabetes mellitus without complications: Secondary | ICD-10-CM | POA: Diagnosis not present

## 2013-06-15 DIAGNOSIS — L0211 Cutaneous abscess of neck: Secondary | ICD-10-CM | POA: Diagnosis not present

## 2013-06-15 DIAGNOSIS — L0291 Cutaneous abscess, unspecified: Secondary | ICD-10-CM | POA: Diagnosis not present

## 2013-06-15 DIAGNOSIS — D234 Other benign neoplasm of skin of scalp and neck: Secondary | ICD-10-CM

## 2013-06-15 DIAGNOSIS — L0390 Cellulitis, unspecified: Secondary | ICD-10-CM | POA: Diagnosis not present

## 2013-06-15 HISTORY — DX: Presence of dental prosthetic device (complete) (partial): Z97.2

## 2013-06-15 HISTORY — DX: Encounter for fitting and adjustment of spectacles and contact lenses: Z46.0

## 2013-06-15 HISTORY — PX: MASS EXCISION: SHX2000

## 2013-06-15 HISTORY — DX: Frequency of micturition: R35.0

## 2013-06-15 LAB — POCT HEMOGLOBIN-HEMACUE: Hemoglobin: 10.4 g/dL — ABNORMAL LOW (ref 12.0–15.0)

## 2013-06-15 LAB — GLUCOSE, CAPILLARY: Glucose-Capillary: 124 mg/dL — ABNORMAL HIGH (ref 70–99)

## 2013-06-15 SURGERY — EXCISION MASS
Anesthesia: General | Site: Neck | Laterality: Right | Wound class: Dirty or Infected

## 2013-06-15 MED ORDER — OXYCODONE HCL 5 MG/5ML PO SOLN: 5.0000 mg | Freq: Once | ORAL | Status: DC | PRN

## 2013-06-15 MED ORDER — LACTATED RINGERS IV SOLN
INTRAVENOUS | Status: DC
  Administered 2013-06-15: 07:00:00 via INTRAVENOUS

## 2013-06-15 MED ORDER — DEXAMETHASONE SODIUM PHOSPHATE 4 MG/ML IJ SOLN
INTRAMUSCULAR | Status: DC | PRN
  Administered 2013-06-15: 4 mg via INTRAVENOUS

## 2013-06-15 MED ORDER — ONDANSETRON HCL 4 MG/2ML IJ SOLN: 4.0000 mg | Freq: Once | INTRAMUSCULAR | Status: DC | PRN

## 2013-06-15 MED ORDER — PROPOFOL 10 MG/ML IV BOLUS
INTRAVENOUS | Status: DC | PRN
  Administered 2013-06-15: 200 mg via INTRAVENOUS

## 2013-06-15 MED ORDER — LIDOCAINE HCL (CARDIAC) 20 MG/ML IV SOLN
INTRAVENOUS | Status: DC | PRN
  Administered 2013-06-15: 75 mg via INTRAVENOUS

## 2013-06-15 MED ORDER — MIDAZOLAM HCL 5 MG/5ML IJ SOLN
INTRAMUSCULAR | Status: DC | PRN
  Administered 2013-06-15: 2 mg via INTRAVENOUS

## 2013-06-15 MED ORDER — SUCCINYLCHOLINE CHLORIDE 20 MG/ML IJ SOLN
INTRAMUSCULAR | Status: DC | PRN
  Administered 2013-06-15: 100 mg via INTRAVENOUS

## 2013-06-15 MED ORDER — ONDANSETRON HCL 4 MG/2ML IJ SOLN
INTRAMUSCULAR | Status: DC | PRN
  Administered 2013-06-15: 4 mg via INTRAVENOUS

## 2013-06-15 MED ORDER — CEFAZOLIN SODIUM-DEXTROSE 2-3 GM-% IV SOLR
2.0000 g | INTRAVENOUS | Status: AC
  Administered 2013-06-15: 2 g via INTRAVENOUS

## 2013-06-15 MED ORDER — CHLORHEXIDINE GLUCONATE 4 % EX LIQD: 1.0000 "application " | Freq: Once | CUTANEOUS | Status: DC

## 2013-06-15 MED ORDER — FENTANYL CITRATE 0.05 MG/ML IJ SOLN
INTRAMUSCULAR | Status: DC | PRN
  Administered 2013-06-15: 100 ug via INTRAVENOUS

## 2013-06-15 MED ORDER — BUPIVACAINE-EPINEPHRINE 0.5% -1:200000 IJ SOLN
INTRAMUSCULAR | Status: DC | PRN
  Administered 2013-06-15: 4 mL

## 2013-06-15 MED ORDER — HYDROMORPHONE HCL PF 1 MG/ML IJ SOLN: 0.2500 mg | INTRAMUSCULAR | Status: DC | PRN

## 2013-06-15 MED ORDER — FENTANYL CITRATE 0.05 MG/ML IJ SOLN: 50.0000 ug | INTRAMUSCULAR | Status: DC | PRN

## 2013-06-15 MED ORDER — MIDAZOLAM HCL 2 MG/2ML IJ SOLN: 1.0000 mg | INTRAMUSCULAR | Status: DC | PRN

## 2013-06-15 MED ORDER — OXYCODONE HCL 5 MG PO TABS: 5.0000 mg | ORAL_TABLET | Freq: Once | ORAL | Status: DC | PRN

## 2013-06-15 MED ORDER — HYDROCODONE-ACETAMINOPHEN 5-325 MG PO TABS
1.0000 | ORAL_TABLET | ORAL | Status: DC | PRN
Start: 2013-06-15 — End: 2013-07-27

## 2013-06-15 SURGICAL SUPPLY — 54 items
BANDAGE ELASTIC 6 VELCRO ST LF (GAUZE/BANDAGES/DRESSINGS) IMPLANT
BENZOIN TINCTURE PRP APPL 2/3 (GAUZE/BANDAGES/DRESSINGS) IMPLANT
BLADE HEX COATED 2.75 (ELECTRODE) ×2 IMPLANT
BLADE SURG 15 STRL LF DISP TIS (BLADE) ×1 IMPLANT
BLADE SURG 15 STRL SS (BLADE) ×1
CANISTER SUCTION 1200CC (MISCELLANEOUS) ×2 IMPLANT
CHLORAPREP W/TINT 26ML (MISCELLANEOUS) IMPLANT
CLOTH BEACON ORANGE TIMEOUT ST (SAFETY) ×2 IMPLANT
COVER MAYO STAND STRL (DRAPES) ×2 IMPLANT
COVER TABLE BACK 60X90 (DRAPES) ×2 IMPLANT
DECANTER SPIKE VIAL GLASS SM (MISCELLANEOUS) IMPLANT
DERMABOND ADVANCED (GAUZE/BANDAGES/DRESSINGS)
DERMABOND ADVANCED .7 DNX12 (GAUZE/BANDAGES/DRESSINGS) IMPLANT
DEVICE DUBIN W/COMP PLATE 8390 (MISCELLANEOUS) IMPLANT
DRAPE LAPAROTOMY TRNSV 102X78 (DRAPE) IMPLANT
DRAPE PED LAPAROTOMY (DRAPES) ×2 IMPLANT
DRAPE UTILITY XL STRL (DRAPES) ×2 IMPLANT
ELECT REM PT RETURN 9FT ADLT (ELECTROSURGICAL) ×2
ELECTRODE REM PT RTRN 9FT ADLT (ELECTROSURGICAL) ×1 IMPLANT
GAUZE SPONGE 4X4 12PLY STRL LF (GAUZE/BANDAGES/DRESSINGS) ×4 IMPLANT
GAUZE SPONGE 4X4 16PLY XRAY LF (GAUZE/BANDAGES/DRESSINGS) IMPLANT
GLOVE BIOGEL PI IND STRL 7.0 (GLOVE) ×1 IMPLANT
GLOVE BIOGEL PI INDICATOR 7.0 (GLOVE) ×1
GLOVE ECLIPSE 6.5 STRL STRAW (GLOVE) ×2 IMPLANT
GLOVE EUDERMIC 7 POWDERFREE (GLOVE) ×2 IMPLANT
GOWN PREVENTION PLUS XLARGE (GOWN DISPOSABLE) ×2 IMPLANT
GOWN PREVENTION PLUS XXLARGE (GOWN DISPOSABLE) ×2 IMPLANT
KIT MARKER MARGIN INK (KITS) IMPLANT
NEEDLE HYPO 22GX1.5 SAFETY (NEEDLE) IMPLANT
NEEDLE HYPO 25X1 1.5 SAFETY (NEEDLE) ×2 IMPLANT
NS IRRIG 1000ML POUR BTL (IV SOLUTION) ×2 IMPLANT
PACK BASIN DAY SURGERY FS (CUSTOM PROCEDURE TRAY) ×2 IMPLANT
PENCIL BUTTON HOLSTER BLD 10FT (ELECTRODE) ×2 IMPLANT
SLEEVE SCD COMPRESS KNEE MED (MISCELLANEOUS) IMPLANT
SPONGE LAP 4X18 X RAY DECT (DISPOSABLE) IMPLANT
STAPLER VISISTAT 35W (STAPLE) IMPLANT
STRIP CLOSURE SKIN 1/2X4 (GAUZE/BANDAGES/DRESSINGS) IMPLANT
SUT ETHILON 4 0 PS 2 18 (SUTURE) IMPLANT
SUT MNCRL AB 4-0 PS2 18 (SUTURE) IMPLANT
SUT SILK 2 0 SH (SUTURE) IMPLANT
SUT VIC AB 2-0 SH 27 (SUTURE)
SUT VIC AB 2-0 SH 27XBRD (SUTURE) IMPLANT
SUT VIC AB 3-0 FS2 27 (SUTURE) IMPLANT
SUT VIC AB 4-0 P-3 18XBRD (SUTURE) IMPLANT
SUT VIC AB 4-0 P3 18 (SUTURE)
SUT VICRYL 3-0 CR8 SH (SUTURE) IMPLANT
SUT VICRYL 4-0 PS2 18IN ABS (SUTURE) IMPLANT
SWAB COLLECTION DEVICE MRSA (MISCELLANEOUS) ×2 IMPLANT
SYR BULB 3OZ (MISCELLANEOUS) IMPLANT
SYR CONTROL 10ML LL (SYRINGE) ×2 IMPLANT
TAPE HYPAFIX 4 X10 (GAUZE/BANDAGES/DRESSINGS) IMPLANT
TOWEL OR NON WOVEN STRL DISP B (DISPOSABLE) ×2 IMPLANT
TUBE CONNECTING 20X1/4 (TUBING) ×2 IMPLANT
YANKAUER SUCT BULB TIP NO VENT (SUCTIONS) ×2 IMPLANT

## 2013-06-15 NOTE — Transfer of Care (Signed)
Immediate Anesthesia Transfer of Care Note  Patient: Catherine Leon  Procedure(s) Performed: Procedure(s): EXCISION right neck MASS (Right)  Patient Location: PACU  Anesthesia Type:General  Level of Consciousness: awake, sedated and responds to stimulation  Airway & Oxygen Therapy: Patient Spontanous Breathing and Patient connected to face mask oxygen  Post-op Assessment: Report given to PACU RN, Post -op Vital signs reviewed and stable and Patient moving all extremities  Post vital signs: Reviewed and stable  Complications: No apparent anesthesia complications

## 2013-06-15 NOTE — Anesthesia Postprocedure Evaluation (Signed)
  Anesthesia Post-op Note  Patient: Catherine Leon  Procedure(s) Performed: Procedure(s): EXCISION right neck MASS (Right)  Patient Location: PACU  Anesthesia Type:General  Level of Consciousness: awake, alert  and oriented  Airway and Oxygen Therapy: Patient Spontanous Breathing  Post-op Pain: mild  Post-op Assessment: Post-op Vital signs reviewed  Post-op Vital Signs: Reviewed  Complications: No apparent anesthesia complications

## 2013-06-15 NOTE — Anesthesia Preprocedure Evaluation (Addendum)
Anesthesia Evaluation  Patient identified by MRN, date of birth, ID band Patient awake    Reviewed: Allergy & Precautions, H&P , NPO status   Airway Mallampati: I TM Distance: >3 FB Neck ROM: Full    Dental  (+) Teeth Intact, Partial Upper and Dental Advisory Given   Pulmonary  breath sounds clear to auscultation        Cardiovascular hypertension, Pt. on medications Rhythm:Regular Rate:Normal     Neuro/Psych    GI/Hepatic   Endo/Other  diabetes, Well Controlled, Type 2, Oral Hypoglycemic Agents  Renal/GU      Musculoskeletal   Abdominal   Peds  Hematology   Anesthesia Other Findings   Reproductive/Obstetrics                          Anesthesia Physical Anesthesia Plan  ASA: III  Anesthesia Plan: General   Post-op Pain Management:    Induction: Intravenous  Airway Management Planned: LMA and Oral ETT  Additional Equipment:   Intra-op Plan:   Post-operative Plan: Extubation in OR  Informed Consent: I have reviewed the patients History and Physical, chart, labs and discussed the procedure including the risks, benefits and alternatives for the proposed anesthesia with the patient or authorized representative who has indicated his/her understanding and acceptance.   Dental advisory given  Plan Discussed with: CRNA, Anesthesiologist and Surgeon  Anesthesia Plan Comments:         Anesthesia Quick Evaluation

## 2013-06-15 NOTE — Anesthesia Procedure Notes (Addendum)
Procedure Name: Intubation Date/Time: 06/15/2013 7:24 AM Performed by: Meyer Russel Pre-anesthesia Checklist: Patient identified, Emergency Drugs available, Suction available and Patient being monitored Patient Re-evaluated:Patient Re-evaluated prior to inductionOxygen Delivery Method: Circle System Utilized Preoxygenation: Pre-oxygenation with 100% oxygen Intubation Type: IV induction Ventilation: Mask ventilation without difficulty Laryngoscope Size: Miller and 2 Grade View: Grade I Tube type: Oral Tube size: 7.0 mm Number of attempts: 1 Airway Equipment and Method: oral airway Placement Confirmation: ETT inserted through vocal cords under direct vision,  positive ETCO2 and breath sounds checked- equal and bilateral Secured at: 22 cm Tube secured with: Tape Dental Injury: Teeth and Oropharynx as per pre-operative assessment

## 2013-06-15 NOTE — Op Note (Signed)
Patient Name:           Catherine Leon   Date of Surgery:        06/15/2013  Pre op Diagnosis:      Recurrent, infected cyst right posterior neck, 2.5 cm diameter  Post op Diagnosis:    same  Procedure:                 Excision 2.5 cm infected cyst right posterior neck, final excision dimensions 4 cm x 2.5 cm.  Surgeon:                     Angelia Mould. Derrell Lolling, M.D., FACS  Assistant:                      None   Operative Indications:   [name] is a 72 y.o. female. She is referred back to me  by Dr. Merri Brunette for evaluation and management of a recurrent infected epidermoid cyst of the right posterior neck.  The patient states that back in the 1990s, one of my partners (Dr. Orpah Greek) excised an infected cyst from the neck. She states that she was told that this would recur. She says that there has been a lump there for some time which has been slowly enlarging. She states that it became more swollen recently and she put soaks on it and it started draining. She was seen in the office recently and noted to have an infected draining cyst and she was referred to me. I started her on doxycycline and scheduled excision of this mass. It is overtly infected with active drainage. She was advised that she will need excision and healing of an open wound by secondary intention. Comorbidities include history of breast cancer, diabetes non-insulin-dependent, hypertension, hyperlipidemia   Operative Findings:       She had a 2.5 cm raised, inflamed mass of the right posterior neck below the hairline far posterior to the angle of the mandible. This was contained in the subcutaneous tissue and did not require dissection down to the muscle fascia.  Procedure in Detail:          Following the induction of general endotracheal anesthesia the patient was placed in the left lateral decubitus position with appropriate padding. The right lateral and right posterior neck were prepped and draped in a sterile fashion.  Intravenous antibiotics were given. Surgical time out was performed. I made a elliptical incision around this mass, ultimately 4 cm in length. This was performed in Langer's lines. Dissection was carried down into the subcutaneous tissue sharply with a knife and with good visualization of the surrounding tissue I completely excised and the skin and subcutaneous tissue and granulation tissue was present. I controlled bleeders with cautery, conservatively. We was irrigated with saline. Because this was a class fIV wound I chose  to pack it open with fine mesh gauze moistened in saline. Hemostasis excellent. EBL 10 cc. Counts correct. Complications none.     Angelia Mould. Derrell Lolling, M.D., FACS General and Minimally Invasive Surgery Breast and Colorectal Surgery  06/15/2013 7:55 AM

## 2013-06-15 NOTE — Interval H&P Note (Signed)
History and Physical Interval Note:  06/15/2013 6:56 AM  Catherine Leon  has presented today for surgery, with the diagnosis of right neck mass 2.5 cm.   The goals and the  various methods of treatment have been discussed with the patient and family. After consideration of risks, benefits and other options for treatment, the patient has consented to  Procedure(s): EXCISION right neck MASS (Right) as a surgical intervention .  The patient's history has been reviewed, patient examined today, no change in status, stable for surgery.  I have reviewed the patient's chart and labs.  Questions were answered to the patient's satisfaction.     Ernestene Mention

## 2013-06-16 ENCOUNTER — Encounter (HOSPITAL_BASED_OUTPATIENT_CLINIC_OR_DEPARTMENT_OTHER): Payer: Self-pay | Admitting: General Surgery

## 2013-06-17 LAB — WOUND CULTURE
Culture: NO GROWTH
Gram Stain: NONE SEEN

## 2013-06-22 DIAGNOSIS — E119 Type 2 diabetes mellitus without complications: Secondary | ICD-10-CM | POA: Diagnosis not present

## 2013-06-24 ENCOUNTER — Encounter (INDEPENDENT_AMBULATORY_CARE_PROVIDER_SITE_OTHER): Payer: Self-pay | Admitting: General Surgery

## 2013-06-24 ENCOUNTER — Ambulatory Visit (INDEPENDENT_AMBULATORY_CARE_PROVIDER_SITE_OTHER): Payer: Medicare Other | Admitting: General Surgery

## 2013-06-24 VITALS — BP 110/72 | HR 72 | Temp 97.4°F | Resp 18 | Ht 65.5 in | Wt 169.5 lb

## 2013-06-24 DIAGNOSIS — D234 Other benign neoplasm of skin of scalp and neck: Secondary | ICD-10-CM

## 2013-06-24 NOTE — Patient Instructions (Signed)
The right neck wound is healing normally. No sign of infection.  Continue saline and gauze packing for one week. After 1 week just put a   dry gauze bandage on it after shower  Return to see Dr. Derrell Lolling in one month.  You will be due for mammograms in August, 2015.

## 2013-06-24 NOTE — Progress Notes (Addendum)
Patient ID: Catherine Leon, female   DOB: November 05, 1940, 72 y.o.   MRN: [id number]  History: This patient underwent excision of a recurrent, infected right neck cyst of 06/15/2013. This was grossly infected and left packed open. No evidence of malignancy. She  changes the bandage at home. She has no pain, but the bandages have been difficult for her family. Final pathology fortunately shows benign inflammatory changes with abscess. No atypia. She does have a history of breast cancer, diabetes, hypertension, hyperlipidemia.  She also is followed for breast cancer surveillance.  In 2001 she underwent right partial mastectomy and axillary lymph node dissection. She had invasive carcinoma, receptor positive, pathologic stage T2, N1.  On 12/11/2009 she underwent right total mastectomy for a recurrent cancer in the right breast. Final pathology was Tmic,, NX, receptor-negative, HER-2-negative. A port was placed. She received chemotherapy. She had extravasation at the port site and had to have plastic surgical reconstruction in the left infraclavicular area. She has had no recurrence to date. She is being followed by Dr. Welton Flakes, and who has now discharged her from her care  She is due for mammograms in August, 2015.  Exam: Patient looks well. No distress Right posterior neck incision is open. The relatively shallow wound with healthy granulation tissue. There is no purulence. No necrosis. No tenderness. No induration. Redressed. She has no trapezius weakness and no sensory deficit.   Assessment: Recurrent, infected sebaceous cyst right posterior neck.recovering uneventfully following excision. History right breast cancer, status post right partial mastectomy and axillary lymph node dissection 2001, and subsequent right total mastectomy for recurrence  2011.  Non-insulin-dependent diabetes mellitus  Hypertension  Hyperlipidemia    Plan: Daily wound care discussed. Anticipate complete healing by secondary  intention in 4-6 weeks Return to see me in one month She is due for mammograms in August of 2015. I will see her for a breast cancer surveillance checkup at that time.     Angelia Mould. Derrell Lolling, M.D., [facility] General and Minimally invasive Surgery Breast and Colorectal Surgery Office:   [phone] Pager:   [phone]

## 2013-07-22 ENCOUNTER — Encounter (INDEPENDENT_AMBULATORY_CARE_PROVIDER_SITE_OTHER): Payer: Medicare Other | Admitting: General Surgery

## 2013-07-27 ENCOUNTER — Ambulatory Visit (INDEPENDENT_AMBULATORY_CARE_PROVIDER_SITE_OTHER): Payer: Medicare Other | Admitting: General Surgery

## 2013-07-27 ENCOUNTER — Encounter (INDEPENDENT_AMBULATORY_CARE_PROVIDER_SITE_OTHER): Payer: Self-pay | Admitting: General Surgery

## 2013-07-27 VITALS — BP 122/82 | HR 60 | Temp 97.5°F | Resp 12 | Ht 65.5 in | Wt 171.0 lb

## 2013-07-27 DIAGNOSIS — Z9889 Other specified postprocedural states: Secondary | ICD-10-CM

## 2013-07-27 DIAGNOSIS — Z853 Personal history of malignant neoplasm of breast: Secondary | ICD-10-CM

## 2013-07-27 NOTE — Progress Notes (Signed)
Patient ID: Catherine Leon, female   DOB: 1941-04-18, 72 y.o.   MRN: [id number]  History:  This patient underwent excision of a recurrent, infected right neck cyst of 06/15/2013. This was grossly infected and left packed open. No evidence of malignancy. She thinks that it has completely healed and she is not needing a bandage anymore.She has no pain,. Final pathology fortunately shows benign inflammatory changes with abscess. No atypia. She does have a history of breast cancer, diabetes, hypertension, hyperlipidemia.  She also is followed for breast cancer surveillance.   In 2001 she underwent right partial mastectomy and axillary lymph node dissection. She had invasive carcinoma, receptor positive, pathologic stage T2, N1.  On 12/11/2009 she underwent right total mastectomy for a recurrent cancer in the right breast. Final pathology was Tmic,, NX, receptor-negative, HER-2-negative. A port was placed. She received chemotherapy. She had extravasation at the port site and had to have plastic surgical reconstruction in the left infraclavicular area. She has had no recurrence to date. She is being followed by Dr. Welton Flakes, and who has now discharged her from her care  She is due for mammograms in August, 2015.    Exam:  Patient looks well. No distress  Right posterior neck incision Is now completely re epithelialized. It is a narrow line with some hyperpigmentation at the edges. No tenderness. No induration She has no trapezius weakness and no sensory deficit.   Assessment:  Recurrent, infected sebaceous cyst right posterior neck.recovering uneventfully following excision.  History right breast cancer, status post right partial mastectomy and axillary lymph node dissection 2001, and subsequent right total mastectomy for recurrence 2011.  Non-insulin-dependent diabetes mellitus  Hypertension  Hyperlipidemia   Plan:   She is due for mammograms in August of 2015. I will see her for a breast cancer  surveillance checkup at that time.   Angelia Mould. Derrell Lolling, M.D., [facility] General and Minimally invasive Surgery Breast and Colorectal Surgery Office:   [phone] Pager:   [phone]

## 2013-07-27 NOTE — Patient Instructions (Signed)
The wound in your right neck is now healed. There is no need for a bandage. The skin pigmentation will slowly settle down over the next 6 months.  Schedule bilateral mammograms in August 2015  Return to see Dr. Derrell Lolling for a breast cancer checkup in August 2015 after you get your mammograms.

## 2013-07-28 LAB — AFB CULTURE WITH SMEAR (NOT AT ARMC)

## 2013-10-17 ENCOUNTER — Encounter (HOSPITAL_BASED_OUTPATIENT_CLINIC_OR_DEPARTMENT_OTHER): Payer: Self-pay | Admitting: Emergency Medicine

## 2013-10-17 ENCOUNTER — Emergency Department (HOSPITAL_BASED_OUTPATIENT_CLINIC_OR_DEPARTMENT_OTHER)
Admission: EM | Admit: 2013-10-17 | Discharge: 2013-10-17 | Disposition: A | Discharge status: Home / Self Care | Payer: Medicare Other | Attending: Emergency Medicine | Admitting: Emergency Medicine

## 2013-10-17 DIAGNOSIS — Z853 Personal history of malignant neoplasm of breast: Secondary | ICD-10-CM | POA: Diagnosis not present

## 2013-10-17 DIAGNOSIS — Z87891 Personal history of nicotine dependence: Secondary | ICD-10-CM | POA: Insufficient documentation

## 2013-10-17 DIAGNOSIS — Z79899 Other long term (current) drug therapy: Secondary | ICD-10-CM | POA: Diagnosis not present

## 2013-10-17 DIAGNOSIS — E119 Type 2 diabetes mellitus without complications: Secondary | ICD-10-CM | POA: Diagnosis not present

## 2013-10-17 DIAGNOSIS — R5383 Other fatigue: Secondary | ICD-10-CM | POA: Diagnosis not present

## 2013-10-17 DIAGNOSIS — I10 Essential (primary) hypertension: Secondary | ICD-10-CM | POA: Diagnosis not present

## 2013-10-17 DIAGNOSIS — E785 Hyperlipidemia, unspecified: Secondary | ICD-10-CM | POA: Insufficient documentation

## 2013-10-17 DIAGNOSIS — R5381 Other malaise: Secondary | ICD-10-CM | POA: Insufficient documentation

## 2013-10-17 DIAGNOSIS — H538 Other visual disturbances: Secondary | ICD-10-CM | POA: Diagnosis not present

## 2013-10-17 DIAGNOSIS — R42 Dizziness and giddiness: Secondary | ICD-10-CM | POA: Insufficient documentation

## 2013-10-17 DIAGNOSIS — Z8719 Personal history of other diseases of the digestive system: Secondary | ICD-10-CM | POA: Diagnosis not present

## 2013-10-17 LAB — URINALYSIS, ROUTINE W REFLEX MICROSCOPIC
BILIRUBIN URINE: NEGATIVE
GLUCOSE, UA: NEGATIVE mg/dL
HGB URINE DIPSTICK: NEGATIVE
Ketones, ur: NEGATIVE mg/dL
Leukocytes, UA: NEGATIVE
Nitrite: NEGATIVE
PH: 6 (ref 5.0–8.0)
Protein, ur: NEGATIVE mg/dL
SPECIFIC GRAVITY, URINE: 1.005 (ref 1.005–1.030)
Urobilinogen, UA: 0.2 mg/dL (ref 0.0–1.0)

## 2013-10-17 LAB — CBC WITH DIFFERENTIAL/PLATELET
Basophils Absolute: 0 10*3/uL (ref 0.0–0.1)
Basophils Relative: 0 % (ref 0–1)
Eosinophils Absolute: 0.1 10*3/uL (ref 0.0–0.7)
Eosinophils Relative: 1 % (ref 0–5)
HEMATOCRIT: 37.3 % (ref 36.0–46.0)
Hemoglobin: 12.8 g/dL (ref 12.0–15.0)
LYMPHS ABS: 2.6 10*3/uL (ref 0.7–4.0)
Lymphocytes Relative: 28 % (ref 12–46)
MCH: 28.2 pg (ref 26.0–34.0)
MCHC: 34.3 g/dL (ref 30.0–36.0)
MCV: 82.2 fL (ref 78.0–100.0)
Monocytes Absolute: 0.7 10*3/uL (ref 0.1–1.0)
Monocytes Relative: 7 % (ref 3–12)
NEUTROS PCT: 64 % (ref 43–77)
Neutro Abs: 5.9 10*3/uL (ref 1.7–7.7)
Platelets: 235 10*3/uL (ref 150–400)
RBC: 4.54 MIL/uL (ref 3.87–5.11)
RDW: 13.8 % (ref 11.5–15.5)
WBC: 9.3 10*3/uL (ref 4.0–10.5)

## 2013-10-17 LAB — GLUCOSE, CAPILLARY: Glucose-Capillary: 130 mg/dL — ABNORMAL HIGH (ref 70–99)

## 2013-10-17 LAB — BASIC METABOLIC PANEL
BUN: 13 mg/dL (ref 6–23)
CO2: 24 mEq/L (ref 19–32)
Calcium: 10.1 mg/dL (ref 8.4–10.5)
Chloride: 102 mEq/L (ref 96–112)
Creatinine, Ser: 1 mg/dL (ref 0.50–1.10)
GFR calc Af Amer: 63 mL/min — ABNORMAL LOW (ref >90)
GFR, EST NON AFRICAN AMERICAN: 55 mL/min — AB (ref >90)
Glucose, Bld: 117 mg/dL — ABNORMAL HIGH (ref 70–99)
POTASSIUM: 3.7 meq/L (ref 3.7–5.3)
Sodium: 142 mEq/L (ref 137–147)

## 2013-10-17 NOTE — ED Notes (Signed)
Pt given snack per vorb from [place]

## 2013-10-17 NOTE — ED Provider Notes (Signed)
CSN: [id number]     Arrival date & time 10/17/13  1702 History   First MD Initiated Contact with Patient 10/17/13 [place]     Chief Complaint  Patient presents with  . Hyperglycemia   (Consider location/radiation/quality/duration/timing/severity/associated sxs/prior Treatment) Patient is a 73 y.o. female presenting with hyperglycemia. The history is provided by the patient.  Hyperglycemia Blood sugar level PTA:  300's Severity:  Moderate Onset quality:  Gradual Duration:  3 days Timing:  Intermittent Diabetes status:  Controlled with oral medications Time since last antidiabetic medication:  10 hours Context: not change in medication, not recent change in diet and not recent illness   Relieved by:  None tried Ineffective treatments:  None tried Associated symptoms: blurred vision (better now), dizziness, fatigue and increased thirst   Associated symptoms: no chest pain, no confusion, no dehydration, no dysuria, no fever, no nausea, no shortness of breath and no vomiting   Risk factors: family hx of diabetes   Risk factors: no hx of DKA and no recent steroid use    Catherine Leon is a 73 y.o. female who presents to the ED with elevated blood sugar. She states that for the past few days she has had blood sugars in the 200's and then today 307. She complains of feeling tired, sluggish and light headed. She does not take insulin she takes Metaglip PO for her diabetes. She is a patient of Dr. Concha Pyo. He regulates her medication.   Past Medical History  Diagnosis Date  . Diabetes mellitus   . Cancer   . Hyperlipidemia   . Hypertension   . Constipation   . Hx of breast cancer 05/04/2012  . Urinary frequency   . Contact lens/glasses fitting     wears contacts or glasses  . Wears partial dentures     partial top   Past Surgical History  Procedure Laterality Date  . Cholecystectomy  1995 - approximate  . Breast surgery  07/16/2000    rt lump-axillary dissectopn  . Mastectomy  12/11/2009    RIGHT BREAST  . Tonsillectomy    . Colonoscopy    . Mass excision Right 06/15/2013    Procedure: EXCISION right neck MASS;  Surgeon: Adin Hector, MD;  Location: [facility];  Service: General;  Laterality: Right;   Family History  Problem Relation Age of Onset  . Heart disease Mother   . Kidney disease Father   . Diabetes Father   . Cancer Paternal Aunt     colon  . Cancer Paternal Aunt     breast   History  Substance Use Topics  . Smoking status: Former Smoker    Quit date: 11/04/1982  . Smokeless tobacco: Never Used  . Alcohol Use: Yes     Comment: WINE. AVERAGE 1 GLASS A MONTH   OB History   Grav Para Term Preterm Abortions TAB SAB Ect Mult Living                 Review of Systems  Constitutional: Positive for fatigue. Negative for fever and chills.  HENT: Negative.   Eyes: Positive for blurred vision (better now) and visual disturbance.  Respiratory: Negative for chest tightness and shortness of breath.   Cardiovascular: Negative for chest pain.  Gastrointestinal: Negative for nausea and vomiting.  Endocrine: Positive for polydipsia.  Genitourinary: Negative for dysuria.  Musculoskeletal: Negative for back pain and myalgias.  Skin: Negative for rash.  Neurological: Positive for dizziness. Negative for syncope and headaches.  Psychiatric/Behavioral: Negative for confusion. The patient is not nervous/anxious.     Allergies  Review of patient's allergies indicates no known allergies.  Home Medications   Current Outpatient Rx  Name  Route  Sig  Dispense  Refill  . glipizide-metformin (METAGLIP) 2.5-250 MG per tablet   Oral   Take 1 tablet by mouth 2 (two) times daily before a meal.          . losartan-hydrochlorothiazide (HYZAAR) 100-25 MG per tablet      every evening.          [name] oxybutynin (DITROPAN) 5 MG tablet   Oral   Take 5 mg by mouth 2 (two) times daily.         . rosuvastatin (CRESTOR) 5 MG tablet   Oral   Take 5 mg by  mouth daily.           BP 138/84  Pulse 82  Temp(Src) 97.9 F (36.6 C) (Oral)  Resp 18  Ht 5\' 6"  (1.676 m)  Wt 173 lb (78.472 kg)  BMI 27.94 kg/m2  SpO2 100% Physical Exam  Nursing note and vitals reviewed. Constitutional: She is oriented to person, place, and time. She appears well-developed and well-nourished.  HENT:  Head: Normocephalic and atraumatic.  Eyes: EOM are normal.  Neck: Neck supple.  Cardiovascular: Normal rate, regular rhythm and normal heart sounds.   Pulmonary/Chest: Effort normal. She has no wheezes. She has no rales.  Abdominal: Soft. Bowel sounds are normal. There is no tenderness.  Musculoskeletal: Normal range of motion.  Neurological: She is alert and oriented to person, place, and time. No cranial nerve deficit.  Skin: Skin is warm and dry.  Psychiatric: She has a normal mood and affect. Her behavior is normal.    ED Course: I discussed this case with Dr. Jeneen Rinks.  Procedures (including critical care time) Labs Review Results for orders placed during the hospital encounter of 10/17/13 (from the past 24 hour(s))  GLUCOSE, CAPILLARY     Status: Abnormal   Collection Time    10/17/13  5:20 PM      Result Value Range   Glucose-Capillary 130 (*) 70 - 99 mg/dL   Comment 1 Notify RN     Comment 2 Documented in Chart    CBC WITH DIFFERENTIAL     Status: None   Collection Time    10/17/13  7:25 PM      Result Value Range   WBC 9.3  4.0 - 10.5 K/uL   RBC 4.54  3.87 - 5.11 MIL/uL   Hemoglobin 12.8  12.0 - 15.0 g/dL   HCT 37.3  36.0 - 46.0 %   MCV 82.2  78.0 - 100.0 fL   MCH 28.2  26.0 - 34.0 pg   MCHC 34.3  30.0 - 36.0 g/dL   RDW 13.8  11.5 - 15.5 %   Platelets 235  150 - 400 K/uL   Neutrophils Relative % 64  43 - 77 %   Neutro Abs 5.9  1.7 - 7.7 K/uL   Lymphocytes Relative 28  12 - 46 %   Lymphs Abs 2.6  0.7 - 4.0 K/uL   Monocytes Relative 7  3 - 12 %   Monocytes Absolute 0.7  0.1 - 1.0 K/uL   Eosinophils Relative 1  0 - 5 %   Eosinophils  Absolute 0.1  0.0 - 0.7 K/uL   Basophils Relative 0  0 - 1 %   Basophils Absolute 0.0  0.0 -  0.1 K/uL  BASIC METABOLIC PANEL     Status: Abnormal   Collection Time    10/17/13  7:25 PM      Result Value Range   Sodium 142  137 - 147 mEq/L   Potassium 3.7  3.7 - 5.3 mEq/L   Chloride 102  96 - 112 mEq/L   CO2 24  19 - 32 mEq/L   Glucose, Bld 117 (*) 70 - 99 mg/dL   BUN 13  6 - 23 mg/dL   Creatinine, Ser 1.00  0.50 - 1.10 mg/dL   Calcium 10.1  8.4 - 10.5 mg/dL   GFR calc non Af Amer 55 (*) >90 mL/min   GFR calc Af Amer 63 (*) >90 mL/min  URINALYSIS, ROUTINE W REFLEX MICROSCOPIC     Status: None   Collection Time    10/17/13  7:33 PM      Result Value Range   Color, Urine YELLOW  YELLOW   APPearance CLEAR  CLEAR   Specific Gravity, Urine 1.005  1.005 - 1.030   pH 6.0  5.0 - 8.0   Glucose, UA NEGATIVE  NEGATIVE mg/dL   Hgb urine dipstick NEGATIVE  NEGATIVE   Bilirubin Urine NEGATIVE  NEGATIVE   Ketones, ur NEGATIVE  NEGATIVE mg/dL   Protein, ur NEGATIVE  NEGATIVE mg/dL   Urobilinogen, UA 0.2  0.0 - 1.0 mg/dL   Nitrite NEGATIVE  NEGATIVE   Leukocytes, UA NEGATIVE  NEGATIVE    MDM  73 y.o. female with elevated blood sugars off and on for the past 3 days. Symptomatic earlier today but no symptoms now. Last blood sugar here tonight was 117. I discussed with the patient that with her blood glucose where it is at this time we would not want to change her medication. She will call her PCP in the morning for follow up. If her symptoms return tonight she will have someone with her who can bring her back to the ED. She is stable for discharge without further screening at this time. No signs of DKA.     [place], [place] 10/17/13 2122

## 2013-10-17 NOTE — ED Notes (Signed)
States her blood sugar has been elevated since Friday, over 200. Today was over 300. States she felt lightheaded and shaky. On oral meds, does use insulin. CBg 130 in triage. Pt reports she is feeling better at present

## 2013-10-17 NOTE — ED Notes (Signed)
Patient states that her blood sugar is in the 300's. Does not insulin at home

## 2013-10-17 NOTE — Discharge Instructions (Signed)
Your blood sugar on arrival to the ED tonight was 130 and the last one was 117. Your blood work show no acute abnormalities. We will not change you medication tonight based on the lab finding and clinical findings. You should see Dr. Shelia Media tomorrow for further evaluation of your Diabetes and discuss your medications and plan of care. Return here as needed for worsening symptoms.

## 2013-10-17 NOTE — ED Notes (Signed)
D/c home with family. Denies pain, dizziness or other symptoms

## 2013-10-20 NOTE — ED Provider Notes (Signed)
Medical screening examination/treatment/procedure(s) were performed by non-physician practitioner and as supervising physician I was immediately available for consultation/collaboration.  EKG Interpretation   None         Tanna Furry, MD 10/20/13 [phone]

## 2013-12-02 ENCOUNTER — Other Ambulatory Visit (INDEPENDENT_AMBULATORY_CARE_PROVIDER_SITE_OTHER): Payer: Self-pay

## 2013-12-02 MED ORDER — UNABLE TO FIND: Status: DC

## 2014-03-23 DIAGNOSIS — E119 Type 2 diabetes mellitus without complications: Secondary | ICD-10-CM | POA: Diagnosis not present

## 2014-03-23 DIAGNOSIS — I10 Essential (primary) hypertension: Secondary | ICD-10-CM | POA: Diagnosis not present

## 2014-04-11 ENCOUNTER — Other Ambulatory Visit: Payer: Self-pay

## 2014-04-11 DIAGNOSIS — Z1231 Encounter for screening mammogram for malignant neoplasm of breast: Secondary | ICD-10-CM

## 2014-04-11 DIAGNOSIS — Z9011 Acquired absence of right breast and nipple: Secondary | ICD-10-CM

## 2014-04-26 DIAGNOSIS — I10 Essential (primary) hypertension: Secondary | ICD-10-CM | POA: Diagnosis not present

## 2014-04-26 DIAGNOSIS — Z1212 Encounter for screening for malignant neoplasm of rectum: Secondary | ICD-10-CM | POA: Diagnosis not present

## 2014-04-26 DIAGNOSIS — E119 Type 2 diabetes mellitus without complications: Secondary | ICD-10-CM | POA: Diagnosis not present

## 2014-04-26 DIAGNOSIS — E049 Nontoxic goiter, unspecified: Secondary | ICD-10-CM | POA: Diagnosis not present

## 2014-04-26 DIAGNOSIS — Z853 Personal history of malignant neoplasm of breast: Secondary | ICD-10-CM | POA: Diagnosis not present

## 2014-04-27 ENCOUNTER — Other Ambulatory Visit: Payer: Self-pay | Admitting: Internal Medicine

## 2014-04-27 DIAGNOSIS — E049 Nontoxic goiter, unspecified: Secondary | ICD-10-CM

## 2014-05-10 ENCOUNTER — Encounter (INDEPENDENT_AMBULATORY_CARE_PROVIDER_SITE_OTHER): Payer: Self-pay | Admitting: General Surgery

## 2014-05-11 ENCOUNTER — Ambulatory Visit
Admission: RE | Admit: 2014-05-11 | Discharge: 2014-05-11 | Disposition: A | Discharge status: Home / Self Care | Payer: Medicare Other | Source: Ambulatory Visit | Attending: Internal Medicine | Admitting: Internal Medicine

## 2014-05-11 DIAGNOSIS — E049 Nontoxic goiter, unspecified: Secondary | ICD-10-CM | POA: Diagnosis not present

## 2014-05-18 ENCOUNTER — Other Ambulatory Visit (INDEPENDENT_AMBULATORY_CARE_PROVIDER_SITE_OTHER): Payer: Self-pay | Admitting: General Surgery

## 2014-05-18 ENCOUNTER — Ambulatory Visit
Admission: RE | Admit: 2014-05-18 | Discharge: 2014-05-18 | Disposition: A | Discharge status: Home / Self Care | Payer: Medicare Other | Source: Ambulatory Visit | Attending: General Surgery | Admitting: General Surgery

## 2014-05-18 ENCOUNTER — Other Ambulatory Visit: Payer: Self-pay

## 2014-05-18 ENCOUNTER — Ambulatory Visit
Admission: RE | Admit: 2014-05-18 | Discharge: 2014-05-18 | Disposition: A | Discharge status: Home / Self Care | Payer: Medicare Other | Source: Ambulatory Visit

## 2014-05-18 DIAGNOSIS — Z1231 Encounter for screening mammogram for malignant neoplasm of breast: Secondary | ICD-10-CM

## 2014-05-18 DIAGNOSIS — Z9011 Acquired absence of right breast and nipple: Secondary | ICD-10-CM

## 2014-05-19 DIAGNOSIS — E079 Disorder of thyroid, unspecified: Secondary | ICD-10-CM | POA: Diagnosis not present

## 2014-05-20 ENCOUNTER — Other Ambulatory Visit: Payer: Self-pay | Admitting: Internal Medicine

## 2014-05-20 DIAGNOSIS — E041 Nontoxic single thyroid nodule: Secondary | ICD-10-CM

## 2014-06-08 ENCOUNTER — Encounter (INDEPENDENT_AMBULATORY_CARE_PROVIDER_SITE_OTHER): Payer: Self-pay

## 2014-06-08 ENCOUNTER — Ambulatory Visit
Admission: RE | Admit: 2014-06-08 | Discharge: 2014-06-08 | Disposition: A | Discharge status: Home / Self Care | Payer: Medicare Other | Source: Ambulatory Visit | Attending: Internal Medicine | Admitting: Internal Medicine

## 2014-06-08 ENCOUNTER — Other Ambulatory Visit (HOSPITAL_COMMUNITY)
Admission: RE | Admit: 2014-06-08 | Discharge: 2014-06-08 | Disposition: A | Discharge status: Home / Self Care | Payer: Medicare Other | Source: Ambulatory Visit | Attending: Interventional Radiology | Admitting: Interventional Radiology

## 2014-06-08 DIAGNOSIS — E049 Nontoxic goiter, unspecified: Secondary | ICD-10-CM | POA: Diagnosis not present

## 2014-06-08 DIAGNOSIS — E041 Nontoxic single thyroid nodule: Secondary | ICD-10-CM | POA: Insufficient documentation

## 2014-06-08 DIAGNOSIS — R220 Localized swelling, mass and lump, head: Secondary | ICD-10-CM | POA: Diagnosis not present

## 2014-06-08 DIAGNOSIS — R221 Localized swelling, mass and lump, neck: Secondary | ICD-10-CM | POA: Diagnosis not present

## 2014-06-26 ENCOUNTER — Encounter (INDEPENDENT_AMBULATORY_CARE_PROVIDER_SITE_OTHER): Payer: Self-pay | Admitting: General Surgery

## 2014-07-14 DIAGNOSIS — E039 Hypothyroidism, unspecified: Secondary | ICD-10-CM | POA: Diagnosis not present

## 2014-07-18 ENCOUNTER — Other Ambulatory Visit (HOSPITAL_COMMUNITY): Payer: Self-pay | Admitting: Endocrinology

## 2014-07-18 DIAGNOSIS — E049 Nontoxic goiter, unspecified: Secondary | ICD-10-CM

## 2014-07-28 DIAGNOSIS — C50911 Malignant neoplasm of unspecified site of right female breast: Secondary | ICD-10-CM | POA: Diagnosis not present

## 2014-07-28 DIAGNOSIS — I10 Essential (primary) hypertension: Secondary | ICD-10-CM | POA: Diagnosis not present

## 2014-07-28 DIAGNOSIS — E119 Type 2 diabetes mellitus without complications: Secondary | ICD-10-CM | POA: Diagnosis not present

## 2014-08-16 ENCOUNTER — Encounter (HOSPITAL_COMMUNITY)
Admission: RE | Admit: 2014-08-16 | Discharge: 2014-08-16 | Disposition: A | Discharge status: Home / Self Care | Payer: Medicare Other | Source: Ambulatory Visit | Attending: Endocrinology | Admitting: Endocrinology

## 2014-08-16 DIAGNOSIS — E049 Nontoxic goiter, unspecified: Secondary | ICD-10-CM | POA: Diagnosis not present

## 2014-08-16 MED ORDER — SODIUM PERTECHNETATE TC 99M INJECTION
10.2000 | Freq: Once | INTRAVENOUS | Status: AC | PRN
  Administered 2014-08-16: 10 via INTRAVENOUS

## 2014-08-30 DIAGNOSIS — E119 Type 2 diabetes mellitus without complications: Secondary | ICD-10-CM | POA: Diagnosis not present

## 2014-11-01 DIAGNOSIS — E780 Pure hypercholesterolemia: Secondary | ICD-10-CM | POA: Diagnosis not present

## 2014-11-01 DIAGNOSIS — E118 Type 2 diabetes mellitus with unspecified complications: Secondary | ICD-10-CM | POA: Diagnosis not present

## 2014-11-08 DIAGNOSIS — I10 Essential (primary) hypertension: Secondary | ICD-10-CM | POA: Diagnosis not present

## 2014-11-08 DIAGNOSIS — E119 Type 2 diabetes mellitus without complications: Secondary | ICD-10-CM | POA: Diagnosis not present

## 2014-11-08 DIAGNOSIS — E780 Pure hypercholesterolemia: Secondary | ICD-10-CM | POA: Diagnosis not present

## 2015-02-09 ENCOUNTER — Other Ambulatory Visit: Payer: Self-pay | Admitting: Endocrinology

## 2015-02-09 DIAGNOSIS — E049 Nontoxic goiter, unspecified: Secondary | ICD-10-CM

## 2015-04-24 ENCOUNTER — Other Ambulatory Visit: Payer: Self-pay | Admitting: General Surgery

## 2015-04-24 DIAGNOSIS — Z9011 Acquired absence of right breast and nipple: Secondary | ICD-10-CM

## 2015-04-24 DIAGNOSIS — Z853 Personal history of malignant neoplasm of breast: Secondary | ICD-10-CM

## 2015-04-24 DIAGNOSIS — Z1231 Encounter for screening mammogram for malignant neoplasm of breast: Secondary | ICD-10-CM

## 2015-06-06 ENCOUNTER — Ambulatory Visit
Admission: RE | Admit: 2015-06-06 | Discharge: 2015-06-06 | Disposition: A | Discharge status: Home / Self Care | Payer: Medicare Other | Source: Ambulatory Visit | Attending: General Surgery | Admitting: General Surgery

## 2015-06-06 DIAGNOSIS — Z853 Personal history of malignant neoplasm of breast: Secondary | ICD-10-CM

## 2015-06-06 DIAGNOSIS — Z9011 Acquired absence of right breast and nipple: Secondary | ICD-10-CM

## 2015-06-06 DIAGNOSIS — Z1231 Encounter for screening mammogram for malignant neoplasm of breast: Secondary | ICD-10-CM

## 2015-06-15 DIAGNOSIS — I10 Essential (primary) hypertension: Secondary | ICD-10-CM | POA: Diagnosis not present

## 2015-06-15 DIAGNOSIS — N390 Urinary tract infection, site not specified: Secondary | ICD-10-CM | POA: Diagnosis not present

## 2015-06-15 DIAGNOSIS — E119 Type 2 diabetes mellitus without complications: Secondary | ICD-10-CM | POA: Diagnosis not present

## 2015-06-15 DIAGNOSIS — R350 Frequency of micturition: Secondary | ICD-10-CM | POA: Diagnosis not present

## 2015-06-15 DIAGNOSIS — R232 Flushing: Secondary | ICD-10-CM | POA: Diagnosis not present

## 2015-06-15 DIAGNOSIS — E780 Pure hypercholesterolemia: Secondary | ICD-10-CM | POA: Diagnosis not present

## 2015-07-07 ENCOUNTER — Ambulatory Visit
Admission: RE | Admit: 2015-07-07 | Discharge: 2015-07-07 | Disposition: A | Discharge status: Home / Self Care | Payer: Medicare Other | Source: Ambulatory Visit | Attending: Endocrinology | Admitting: Endocrinology

## 2015-07-07 DIAGNOSIS — E049 Nontoxic goiter, unspecified: Secondary | ICD-10-CM

## 2015-07-14 DIAGNOSIS — I10 Essential (primary) hypertension: Secondary | ICD-10-CM | POA: Diagnosis not present

## 2015-07-14 DIAGNOSIS — E049 Nontoxic goiter, unspecified: Secondary | ICD-10-CM | POA: Diagnosis not present

## 2015-07-14 DIAGNOSIS — E119 Type 2 diabetes mellitus without complications: Secondary | ICD-10-CM | POA: Diagnosis not present

## 2015-08-29 DIAGNOSIS — E119 Type 2 diabetes mellitus without complications: Secondary | ICD-10-CM | POA: Diagnosis not present

## 2016-04-29 ENCOUNTER — Other Ambulatory Visit: Payer: Self-pay | Admitting: Internal Medicine

## 2016-04-29 DIAGNOSIS — Z1231 Encounter for screening mammogram for malignant neoplasm of breast: Secondary | ICD-10-CM

## 2016-05-04 IMAGING — US US SOFT TISSUE HEAD/NECK
1 series · 14 of 25 positions shown · non-contrast
Comparison: 09/29/2007

CLINICAL DATA: GOITER

EXAM:
THYROID ULTRASOUND
TECHNIQUE: Ultrasound examination of the thyroid gland and adjacent soft
tissues was performed.

[Series 1: us soft tissue head/neck · 0.06mm/px · 55 acquisitions, 14 frames shown]
[im 1/55]
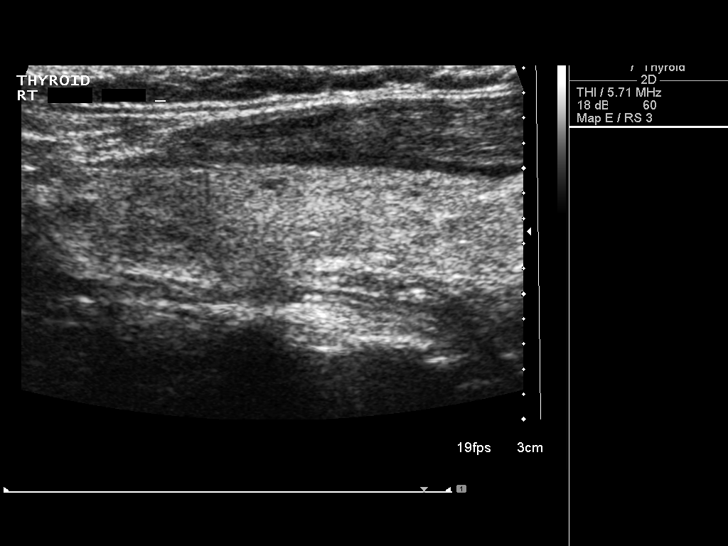
[im 5/55]
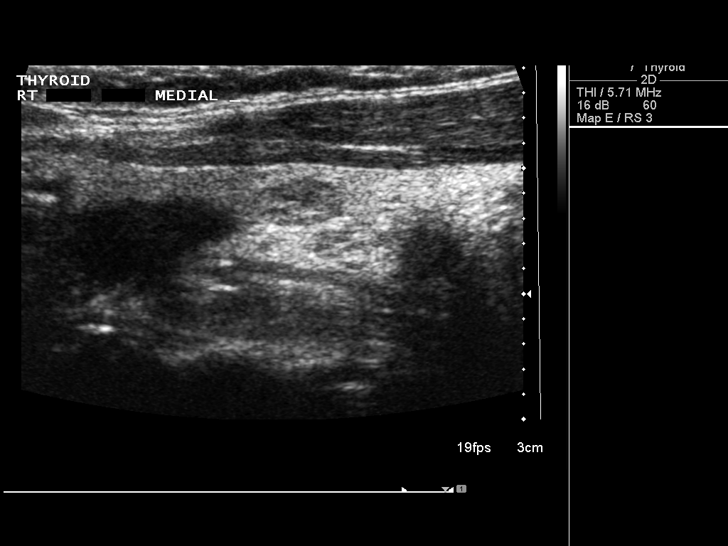
[im 10/55]
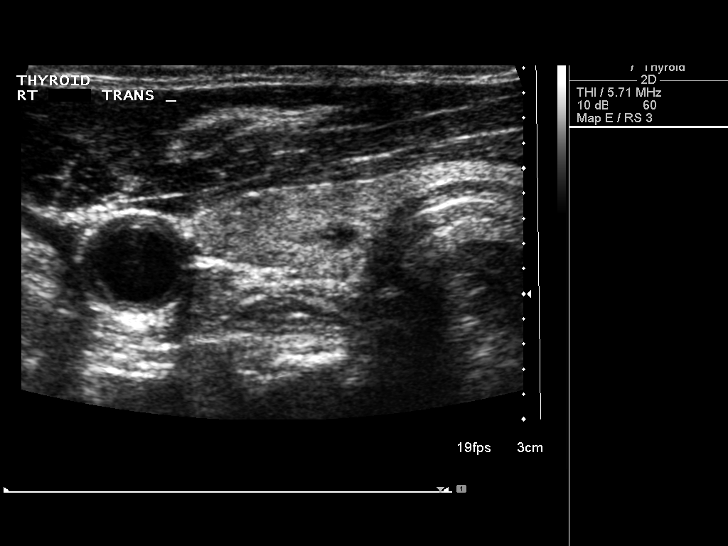
[im 14/55]
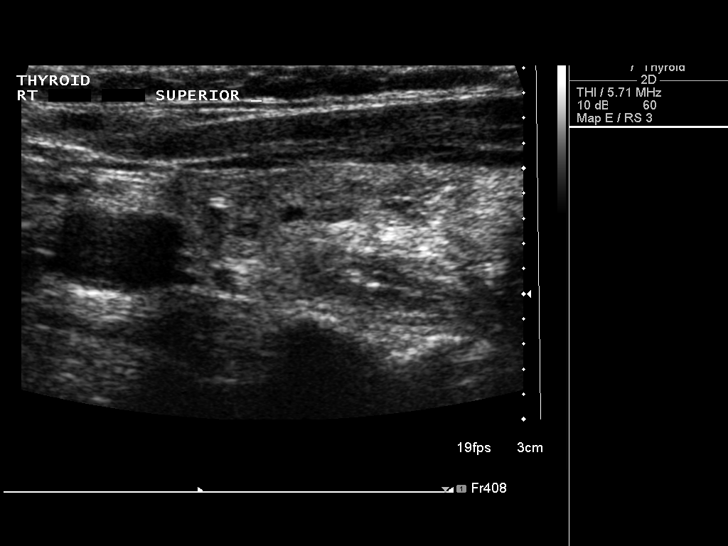
[im 19/55]
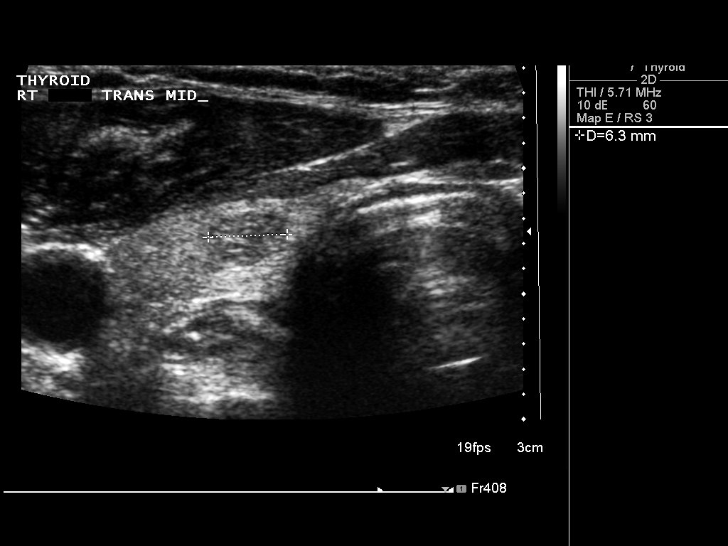
[im 21/55]
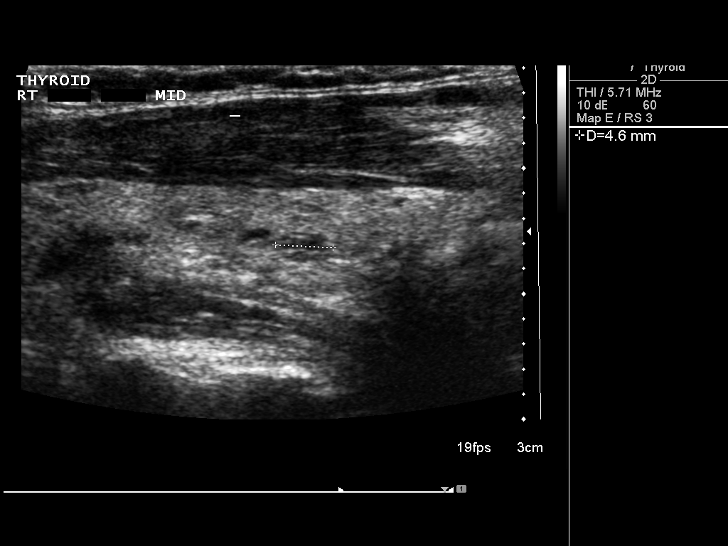
[im 25/55]
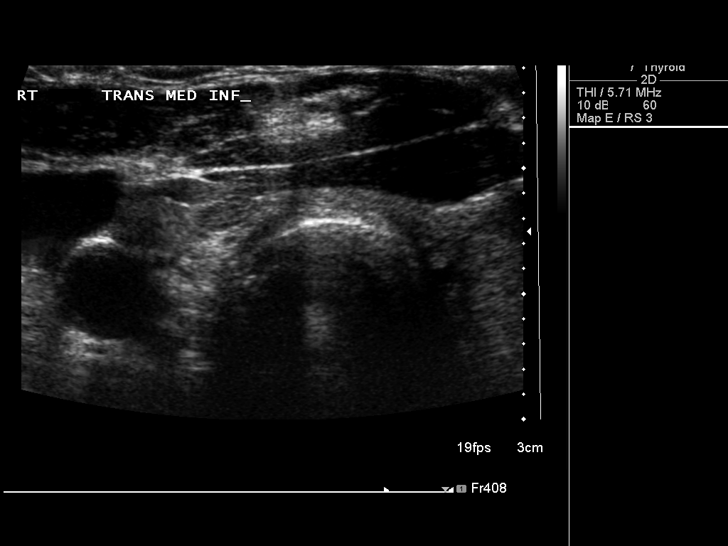
[im 30/55]
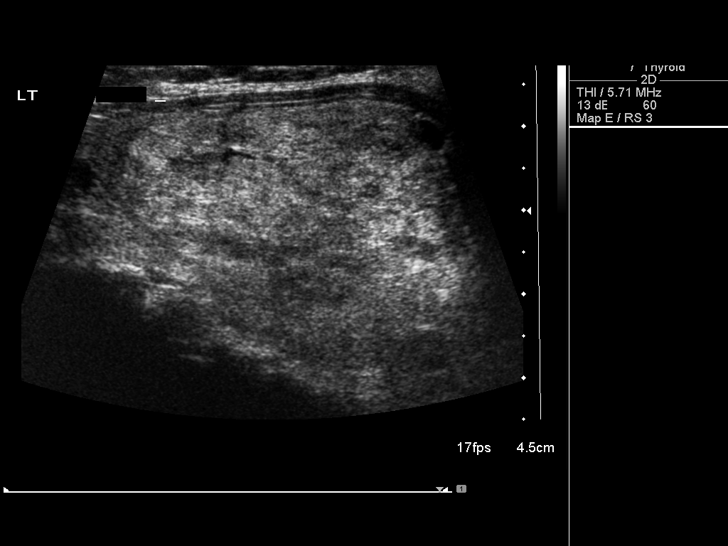
[im 34/55]
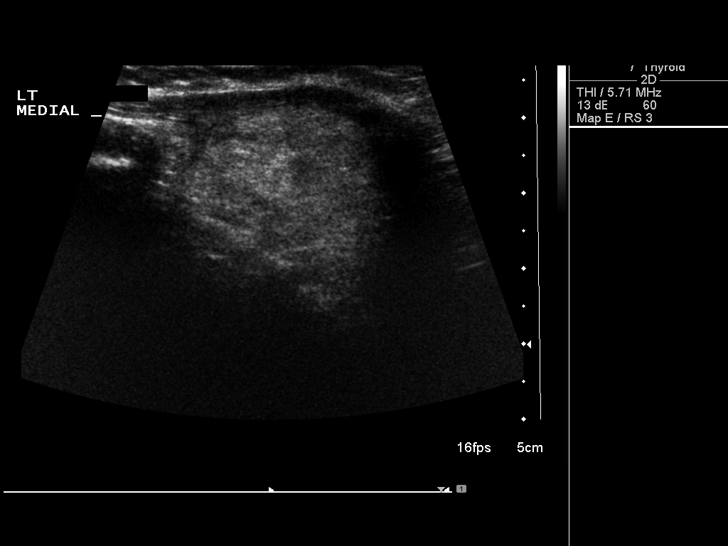
[im 37/55]
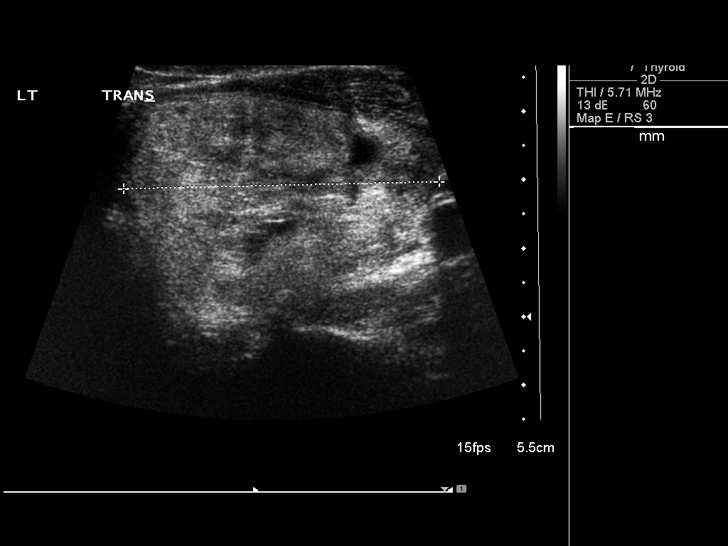
[im 41/55]
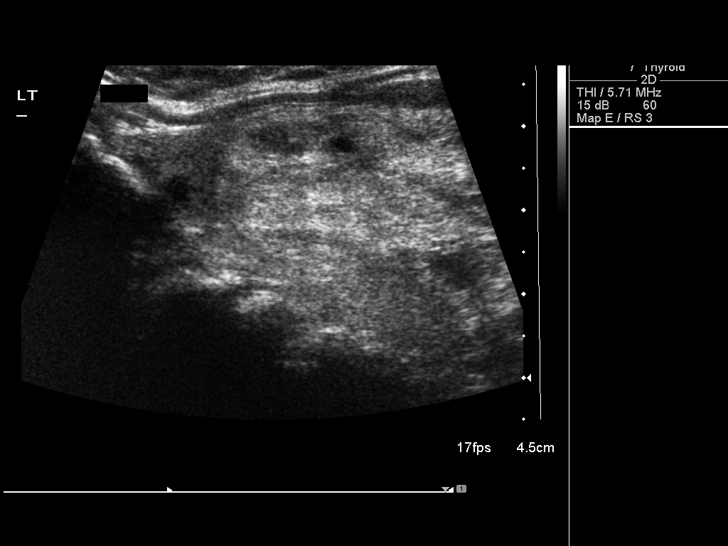
[im 46/55]
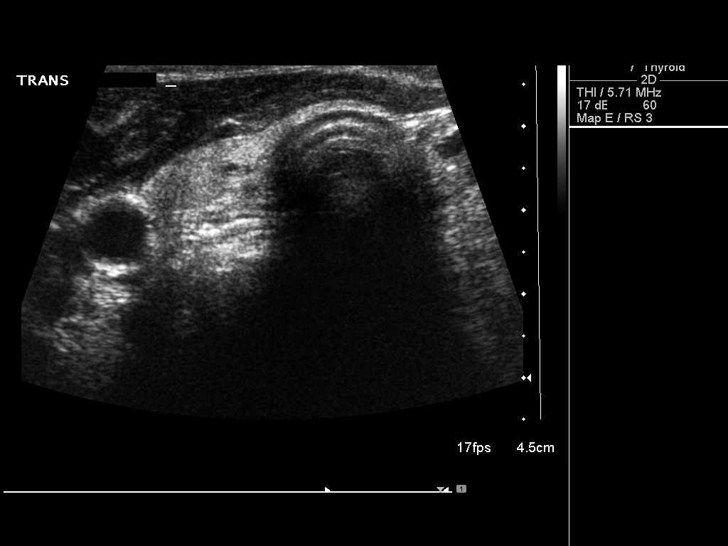
[im 50/55]
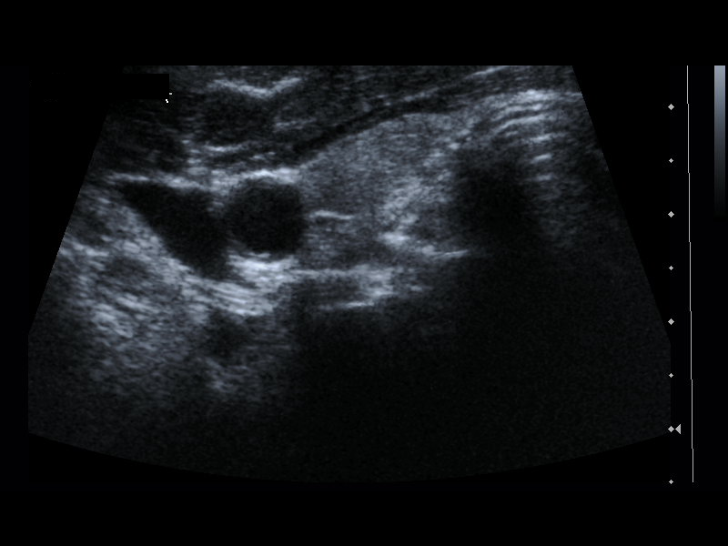
[im 55/55]
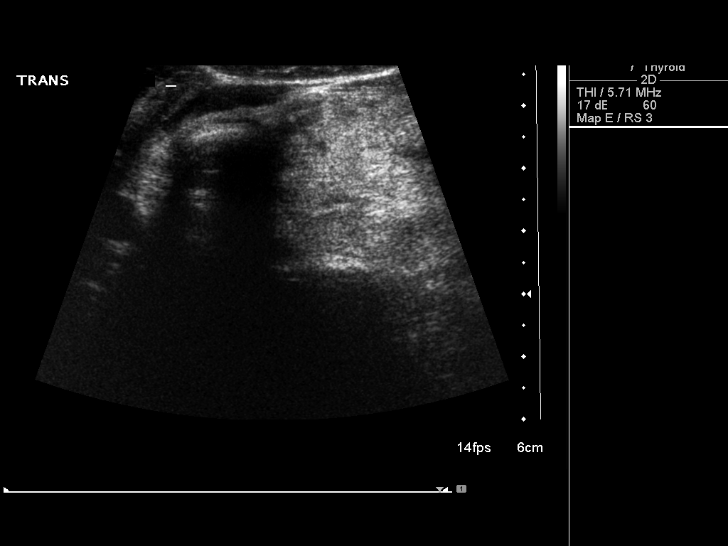

[14 of 25 positions shown; findings below may reference images not displayed]

FINDINGS: Right thyroid lobe

Measurements: 42 x 10 x 16 mm. Inhomogeneous echotexture with
multiple small nodules, largest 7 x 4 x 6 mm, mid lobe.

Left thyroid lobe

Measurements: 59 x 35 x 46 mm.. There is a dominant 58 x 33 x 46 mm
mass replacing/ displacing most of the lower lobe (previously 4 x
2.5 x 2.9 cm).

Isthmus

Thickness: 2 mm.  No nodules visualized.

Lymphadenopathy

None visualized.
IMPRESSION: 1. Thyromegaly with dominant 5.8 cm left lesion, increased since
prior study. Findings meet consensus criteria for biopsy.
Ultrasound-guided fine needle aspiration should be considered, as
per the consensus statement: Management of Thyroid Nodules Detected
at US: Society of Radiologists in Ultrasound Consensus Conference

## 2016-05-14 ENCOUNTER — Other Ambulatory Visit: Payer: Self-pay

## 2016-06-11 DIAGNOSIS — I10 Essential (primary) hypertension: Secondary | ICD-10-CM | POA: Diagnosis not present

## 2016-06-11 DIAGNOSIS — E7800 Pure hypercholesterolemia, unspecified: Secondary | ICD-10-CM | POA: Diagnosis not present

## 2016-06-11 DIAGNOSIS — E119 Type 2 diabetes mellitus without complications: Secondary | ICD-10-CM | POA: Diagnosis not present

## 2016-06-12 ENCOUNTER — Ambulatory Visit
Admission: RE | Admit: 2016-06-12 | Discharge: 2016-06-12 | Disposition: A | Discharge status: Home / Self Care | Payer: Medicare Other | Source: Ambulatory Visit | Attending: Internal Medicine | Admitting: Internal Medicine

## 2016-06-12 DIAGNOSIS — Z1231 Encounter for screening mammogram for malignant neoplasm of breast: Secondary | ICD-10-CM | POA: Diagnosis not present

## 2016-06-13 DIAGNOSIS — Z0000 Encounter for general adult medical examination without abnormal findings: Secondary | ICD-10-CM | POA: Diagnosis not present

## 2016-06-13 DIAGNOSIS — Z23 Encounter for immunization: Secondary | ICD-10-CM | POA: Diagnosis not present

## 2016-06-18 DIAGNOSIS — I10 Essential (primary) hypertension: Secondary | ICD-10-CM | POA: Diagnosis not present

## 2016-06-18 DIAGNOSIS — E7800 Pure hypercholesterolemia, unspecified: Secondary | ICD-10-CM | POA: Diagnosis not present

## 2016-06-18 DIAGNOSIS — E049 Nontoxic goiter, unspecified: Secondary | ICD-10-CM | POA: Diagnosis not present

## 2016-06-18 DIAGNOSIS — Z853 Personal history of malignant neoplasm of breast: Secondary | ICD-10-CM | POA: Diagnosis not present

## 2016-06-25 DIAGNOSIS — Z1211 Encounter for screening for malignant neoplasm of colon: Secondary | ICD-10-CM | POA: Diagnosis not present

## 2016-06-25 DIAGNOSIS — Z1212 Encounter for screening for malignant neoplasm of rectum: Secondary | ICD-10-CM | POA: Diagnosis not present

## 2016-07-04 ENCOUNTER — Other Ambulatory Visit: Payer: Self-pay

## 2016-07-04 DIAGNOSIS — D239 Other benign neoplasm of skin, unspecified: Secondary | ICD-10-CM | POA: Diagnosis not present

## 2016-07-04 DIAGNOSIS — D485 Neoplasm of uncertain behavior of skin: Secondary | ICD-10-CM | POA: Diagnosis not present

## 2016-07-04 DIAGNOSIS — B079 Viral wart, unspecified: Secondary | ICD-10-CM | POA: Diagnosis not present

## 2016-07-04 DIAGNOSIS — L910 Hypertrophic scar: Secondary | ICD-10-CM | POA: Diagnosis not present

## 2016-08-15 DIAGNOSIS — B079 Viral wart, unspecified: Secondary | ICD-10-CM | POA: Diagnosis not present

## 2016-08-15 DIAGNOSIS — L910 Hypertrophic scar: Secondary | ICD-10-CM | POA: Diagnosis not present

## 2016-09-18 DIAGNOSIS — I10 Essential (primary) hypertension: Secondary | ICD-10-CM | POA: Diagnosis not present

## 2016-09-18 DIAGNOSIS — E119 Type 2 diabetes mellitus without complications: Secondary | ICD-10-CM | POA: Diagnosis not present

## 2016-09-18 DIAGNOSIS — C50911 Malignant neoplasm of unspecified site of right female breast: Secondary | ICD-10-CM | POA: Diagnosis not present

## 2016-09-19 DIAGNOSIS — E119 Type 2 diabetes mellitus without complications: Secondary | ICD-10-CM | POA: Diagnosis not present

## 2016-09-26 DIAGNOSIS — L910 Hypertrophic scar: Secondary | ICD-10-CM | POA: Diagnosis not present

## 2016-09-26 DIAGNOSIS — D2370 Other benign neoplasm of skin of unspecified lower limb, including hip: Secondary | ICD-10-CM | POA: Diagnosis not present

## 2016-10-09 DIAGNOSIS — L905 Scar conditions and fibrosis of skin: Secondary | ICD-10-CM | POA: Diagnosis not present

## 2016-10-09 DIAGNOSIS — T80810S Extravasation of vesicant antineoplastic chemotherapy, sequela: Secondary | ICD-10-CM | POA: Diagnosis not present

## 2016-10-09 DIAGNOSIS — Z853 Personal history of malignant neoplasm of breast: Secondary | ICD-10-CM | POA: Diagnosis not present

## 2016-10-16 ENCOUNTER — Encounter (HOSPITAL_BASED_OUTPATIENT_CLINIC_OR_DEPARTMENT_OTHER): Payer: Self-pay | Admitting: *Deleted

## 2016-10-16 NOTE — H&P (Signed)
Subjective:     Patient ID: Catherine Leon is a 76 y.o. female.  HPI  Referred by Dr. Dalbert Batman for evaluation. Patient with history right breast cancer 2001 and underwent lumpectomy and adjuvant radiation, chemotherapy. Developed extravasation left chest port of chemotherapeutic agent and has developed firm hyperpigmented scar in area. Notes it was initially fat then became firm, has tried steroid injection to area x 2 with dermatology. Notes at time of incident was hospitalized, had drain and wound.  Last MMG left breast 05/2016 normal. Had recurrence right breast cancer 2011 and underwent mastectomy, no adjuvant treatment.  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 Very active traveling , bowling, and is in play this month. Traveling to  [place] in April.  PMH significant for DM, last HbA1c 6.9  Review of Systems  Eyes: Positive for visual disturbance.  Musculoskeletal: Positive for arthralgias.  Remainder 12 point review negative     Objective:   Physical Exam  Constitutional: She is oriented to person, place, and time.  Cardiovascular: Normal rate, regular rhythm and normal heart sounds.   Pulmonary/Chest: Effort normal and breath sounds normal.  Neurological: She is alert and oriented to person, place, and time.  Skin:  Fitzpatrick 5  Left chest: port scar faded fine line, inferior to this with indurated mass and hyperpigmentation irregular borders with greatest dimensions 5 x 5 cm  Left breast without masses, right breast absent and chest wall without  masses     Assessment:     Extravasation chemotherapy sequela Hx breast cancer    Plan:     This is likely fat necrosis. Offered excision of area, though this will produce scar longer than lesion itself, take months for scar to mature. Given width/area we discussed vertically vs horizontally oriented scar and she prefers vertical as may be more hidden in clothes. Plan OP surgery, reviewed post procedure  limitations and recommend 7-10 days off all of her exercise, bowling activities.   Irene Limbo, MD [facility] Plastic & Reconstructive Surgery [phone], pin [phone]

## 2016-10-17 ENCOUNTER — Other Ambulatory Visit: Payer: Self-pay

## 2016-10-17 ENCOUNTER — Encounter (HOSPITAL_BASED_OUTPATIENT_CLINIC_OR_DEPARTMENT_OTHER)
Admission: RE | Admit: 2016-10-17 | Discharge: 2016-10-17 | Disposition: A | Discharge status: Home / Self Care | Payer: Medicare Other | Source: Ambulatory Visit | Attending: Plastic Surgery | Admitting: Plastic Surgery

## 2016-10-17 DIAGNOSIS — I10 Essential (primary) hypertension: Secondary | ICD-10-CM | POA: Diagnosis not present

## 2016-10-17 DIAGNOSIS — E119 Type 2 diabetes mellitus without complications: Secondary | ICD-10-CM | POA: Diagnosis not present

## 2016-10-17 LAB — COMPREHENSIVE METABOLIC PANEL
ALT: 17 U/L (ref 14–54)
AST: 24 U/L (ref 15–41)
Albumin: 4.2 g/dL (ref 3.5–5.0)
Alkaline Phosphatase: 61 U/L (ref 38–126)
Anion gap: 10 (ref 5–15)
BUN: 11 mg/dL (ref 6–20)
CHLORIDE: 104 mmol/L (ref 101–111)
CO2: 30 mmol/L (ref 22–32)
Calcium: 10 mg/dL (ref 8.9–10.3)
Creatinine, Ser: 1.1 mg/dL — ABNORMAL HIGH (ref 0.44–1.00)
GFR, EST AFRICAN AMERICAN: 55 mL/min — AB (ref >60)
GFR, EST NON AFRICAN AMERICAN: 48 mL/min — AB (ref >60)
Glucose, Bld: 144 mg/dL — ABNORMAL HIGH (ref 65–99)
Potassium: 3.8 mmol/L (ref 3.5–5.1)
Sodium: 144 mmol/L (ref 135–145)
TOTAL PROTEIN: 6.8 g/dL (ref 6.5–8.1)
Total Bilirubin: 0.8 mg/dL (ref 0.3–1.2)

## 2016-10-17 NOTE — Progress Notes (Signed)
Dr Nyoka Cowden reviewed EKG ok proceed with sx scheduled for 10/22/2016

## 2016-10-22 ENCOUNTER — Ambulatory Visit (HOSPITAL_BASED_OUTPATIENT_CLINIC_OR_DEPARTMENT_OTHER): Payer: Medicare Other | Admitting: Anesthesiology

## 2016-10-22 ENCOUNTER — Ambulatory Visit (HOSPITAL_BASED_OUTPATIENT_CLINIC_OR_DEPARTMENT_OTHER)
Admission: RE | Admit: 2016-10-22 | Discharge: 2016-10-22 | Disposition: A | Discharge status: Home / Self Care | Payer: Medicare Other | Source: Ambulatory Visit | Attending: Plastic Surgery | Admitting: Plastic Surgery

## 2016-10-22 ENCOUNTER — Encounter (HOSPITAL_BASED_OUTPATIENT_CLINIC_OR_DEPARTMENT_OTHER): Payer: Self-pay | Admitting: *Deleted

## 2016-10-22 ENCOUNTER — Encounter (HOSPITAL_BASED_OUTPATIENT_CLINIC_OR_DEPARTMENT_OTHER)
Admission: RE | Disposition: A | Discharge status: Home / Self Care | Payer: Self-pay | Source: Ambulatory Visit | Attending: Plastic Surgery

## 2016-10-22 DIAGNOSIS — Z853 Personal history of malignant neoplasm of breast: Secondary | ICD-10-CM | POA: Insufficient documentation

## 2016-10-22 DIAGNOSIS — T80810A Extravasation of vesicant antineoplastic chemotherapy, initial encounter: Secondary | ICD-10-CM | POA: Diagnosis not present

## 2016-10-22 DIAGNOSIS — T80810D Extravasation of vesicant antineoplastic chemotherapy, subsequent encounter: Secondary | ICD-10-CM | POA: Diagnosis not present

## 2016-10-22 DIAGNOSIS — E119 Type 2 diabetes mellitus without complications: Secondary | ICD-10-CM | POA: Insufficient documentation

## 2016-10-22 DIAGNOSIS — Z7984 Long term (current) use of oral hypoglycemic drugs: Secondary | ICD-10-CM | POA: Insufficient documentation

## 2016-10-22 DIAGNOSIS — T8089XA Other complications following infusion, transfusion and therapeutic injection, initial encounter: Secondary | ICD-10-CM | POA: Diagnosis not present

## 2016-10-22 DIAGNOSIS — L905 Scar conditions and fibrosis of skin: Secondary | ICD-10-CM | POA: Insufficient documentation

## 2016-10-22 DIAGNOSIS — I10 Essential (primary) hypertension: Secondary | ICD-10-CM | POA: Diagnosis not present

## 2016-10-22 DIAGNOSIS — L988 Other specified disorders of the skin and subcutaneous tissue: Secondary | ICD-10-CM | POA: Insufficient documentation

## 2016-10-22 DIAGNOSIS — Z79899 Other long term (current) drug therapy: Secondary | ICD-10-CM | POA: Insufficient documentation

## 2016-10-22 DIAGNOSIS — Z87891 Personal history of nicotine dependence: Secondary | ICD-10-CM | POA: Insufficient documentation

## 2016-10-22 DIAGNOSIS — N319 Neuromuscular dysfunction of bladder, unspecified: Secondary | ICD-10-CM | POA: Diagnosis not present

## 2016-10-22 DIAGNOSIS — T80810S Extravasation of vesicant antineoplastic chemotherapy, sequela: Secondary | ICD-10-CM | POA: Insufficient documentation

## 2016-10-22 DIAGNOSIS — L729 Follicular cyst of the skin and subcutaneous tissue, unspecified: Secondary | ICD-10-CM | POA: Diagnosis not present

## 2016-10-22 HISTORY — PX: LESION EXCISION WITH COMPLEX REPAIR: SHX6700

## 2016-10-22 LAB — GLUCOSE, CAPILLARY
GLUCOSE-CAPILLARY: 140 mg/dL — AB (ref 65–99)
Glucose-Capillary: 122 mg/dL — ABNORMAL HIGH (ref 65–99)

## 2016-10-22 LAB — POCT HEMOGLOBIN-HEMACUE: HEMOGLOBIN: 11.8 g/dL — AB (ref 12.0–15.0)

## 2016-10-22 SURGERY — LESION EXCISION WITH COMPLEX REPAIR
Anesthesia: General | Site: Chest | Laterality: Left

## 2016-10-22 MED ORDER — CHLORHEXIDINE GLUCONATE CLOTH 2 % EX PADS: 6.0000 | MEDICATED_PAD | Freq: Once | CUTANEOUS | Status: DC

## 2016-10-22 MED ORDER — FENTANYL CITRATE (PF) 100 MCG/2ML IJ SOLN
INTRAMUSCULAR | Status: AC
  Filled 2016-10-22: qty 2

## 2016-10-22 MED ORDER — LACTATED RINGERS IV SOLN
INTRAVENOUS | Status: DC
  Administered 2016-10-22: 10:00:00 via INTRAVENOUS

## 2016-10-22 MED ORDER — EPHEDRINE 5 MG/ML INJ
INTRAVENOUS | Status: AC
  Filled 2016-10-22: qty 10

## 2016-10-22 MED ORDER — EPHEDRINE SULFATE 50 MG/ML IJ SOLN
INTRAMUSCULAR | Status: DC | PRN
  Administered 2016-10-22 (×3): 10 mg via INTRAVENOUS

## 2016-10-22 MED ORDER — ONDANSETRON HCL 4 MG/2ML IJ SOLN
INTRAMUSCULAR | Status: DC | PRN
  Administered 2016-10-22: 4 mg via INTRAVENOUS

## 2016-10-22 MED ORDER — LIDOCAINE 2% (20 MG/ML) 5 ML SYRINGE
INTRAMUSCULAR | Status: AC
Start: 2016-10-22 — End: 2016-10-22
  Filled 2016-10-22: qty 5

## 2016-10-22 MED ORDER — BUPIVACAINE-EPINEPHRINE 0.5% -1:200000 IJ SOLN
INTRAMUSCULAR | Status: DC | PRN
  Administered 2016-10-22: 18 mL

## 2016-10-22 MED ORDER — CEFAZOLIN SODIUM-DEXTROSE 2-4 GM/100ML-% IV SOLN
INTRAVENOUS | Status: AC
  Filled 2016-10-22: qty 100

## 2016-10-22 MED ORDER — CHLORHEXIDINE GLUCONATE CLOTH 2 % EX PADS
6.0000 | MEDICATED_PAD | Freq: Once | CUTANEOUS | Status: DC
Start: 2016-10-22 — End: 2016-10-22

## 2016-10-22 MED ORDER — SUCCINYLCHOLINE CHLORIDE 200 MG/10ML IV SOSY
PREFILLED_SYRINGE | INTRAVENOUS | Status: DC | PRN
  Administered 2016-10-22: 100 mg via INTRAVENOUS

## 2016-10-22 MED ORDER — SUGAMMADEX SODIUM 200 MG/2ML IV SOLN
INTRAVENOUS | Status: AC
  Filled 2016-10-22: qty 2

## 2016-10-22 MED ORDER — HYDROCODONE-ACETAMINOPHEN 5-325 MG PO TABS: 1.0000 | ORAL_TABLET | Freq: Four times a day (QID) | ORAL | 0 refills | Status: DC | PRN

## 2016-10-22 MED ORDER — PROPOFOL 10 MG/ML IV BOLUS
INTRAVENOUS | Status: AC
  Filled 2016-10-22: qty 20

## 2016-10-22 MED ORDER — SCOPOLAMINE 1 MG/3DAYS TD PT72: 1.0000 | MEDICATED_PATCH | Freq: Once | TRANSDERMAL | Status: DC | PRN

## 2016-10-22 MED ORDER — ONDANSETRON HCL 4 MG/2ML IJ SOLN
INTRAMUSCULAR | Status: AC
  Filled 2016-10-22: qty 2

## 2016-10-22 MED ORDER — MIDAZOLAM HCL 2 MG/2ML IJ SOLN: 1.0000 mg | INTRAMUSCULAR | Status: DC | PRN

## 2016-10-22 MED ORDER — FENTANYL CITRATE (PF) 100 MCG/2ML IJ SOLN
50.0000 ug | INTRAMUSCULAR | Status: DC | PRN
Start: 2016-10-22 — End: 2016-10-22

## 2016-10-22 MED ORDER — BUPIVACAINE-EPINEPHRINE (PF) 0.5% -1:200000 IJ SOLN
INTRAMUSCULAR | Status: AC
Start: 2016-10-22 — End: 2016-10-22
  Filled 2016-10-22: qty 30

## 2016-10-22 MED ORDER — LIDOCAINE HCL (CARDIAC) 20 MG/ML IV SOLN
INTRAVENOUS | Status: DC | PRN
  Administered 2016-10-22: 50 mg via INTRAVENOUS

## 2016-10-22 MED ORDER — PROPOFOL 10 MG/ML IV BOLUS
INTRAVENOUS | Status: AC
Start: 2016-10-22 — End: 2016-10-22
  Filled 2016-10-22: qty 20

## 2016-10-22 MED ORDER — CEFAZOLIN SODIUM-DEXTROSE 2-4 GM/100ML-% IV SOLN
2.0000 g | INTRAVENOUS | Status: AC
  Administered 2016-10-22: 2 g via INTRAVENOUS

## 2016-10-22 MED ORDER — LIDOCAINE 2% (20 MG/ML) 5 ML SYRINGE
INTRAMUSCULAR | Status: AC
  Filled 2016-10-22: qty 5

## 2016-10-22 MED ORDER — DEXAMETHASONE SODIUM PHOSPHATE 10 MG/ML IJ SOLN
INTRAMUSCULAR | Status: AC
  Filled 2016-10-22: qty 1

## 2016-10-22 MED ORDER — FENTANYL CITRATE (PF) 100 MCG/2ML IJ SOLN
25.0000 ug | INTRAMUSCULAR | Status: DC | PRN
  Administered 2016-10-22 (×3): 25 ug via INTRAVENOUS

## 2016-10-22 MED ORDER — ONDANSETRON HCL 4 MG/2ML IJ SOLN: 4.0000 mg | Freq: Once | INTRAMUSCULAR | Status: DC | PRN

## 2016-10-22 MED ORDER — FENTANYL CITRATE (PF) 100 MCG/2ML IJ SOLN
INTRAMUSCULAR | Status: DC | PRN
  Administered 2016-10-22: 100 ug via INTRAVENOUS

## 2016-10-22 SURGICAL SUPPLY — 43 items
BLADE CLIPPER SURG (BLADE) IMPLANT
BLADE SURG 15 STRL LF DISP TIS (BLADE) ×1 IMPLANT
BLADE SURG 15 STRL SS (BLADE) ×1
COTTONBALL LRG STERILE PKG (GAUZE/BANDAGES/DRESSINGS) IMPLANT
COVER BACK TABLE 60X90IN (DRAPES) ×2 IMPLANT
COVER MAYO STAND STRL (DRAPES) ×2 IMPLANT
DECANTER SPIKE VIAL GLASS SM (MISCELLANEOUS) ×2 IMPLANT
DRAPE LAPAROSCOPIC ABDOMINAL (DRAPES) ×2 IMPLANT
DRAPE U-SHAPE 76X120 STRL (DRAPES) IMPLANT
DRAPE UTILITY 15X26 TOWEL STRL (DRAPES) ×2 IMPLANT
DRSG PAD ABDOMINAL 8X10 ST (GAUZE/BANDAGES/DRESSINGS) ×2 IMPLANT
ELECT COATED BLADE 2.86 ST (ELECTRODE) IMPLANT
ELECT NEEDLE BLADE 2-5/6 (NEEDLE) ×2 IMPLANT
ELECT REM PT RETURN 9FT ADLT (ELECTROSURGICAL) ×2
ELECTRODE REM PT RTRN 9FT ADLT (ELECTROSURGICAL) ×1 IMPLANT
GAUZE XEROFORM 1X8 LF (GAUZE/BANDAGES/DRESSINGS) IMPLANT
GAUZE XEROFORM 5X9 LF (GAUZE/BANDAGES/DRESSINGS) IMPLANT
GLOVE BIO SURGEON STRL SZ 6 (GLOVE) ×2 IMPLANT
GLOVE BIO SURGEON STRL SZ 6.5 (GLOVE) IMPLANT
GOWN STRL REUS W/ TWL LRG LVL3 (GOWN DISPOSABLE) ×2 IMPLANT
GOWN STRL REUS W/TWL LRG LVL3 (GOWN DISPOSABLE) ×2
LIQUID BAND (GAUZE/BANDAGES/DRESSINGS) ×2 IMPLANT
NEEDLE PRECISIONGLIDE 27X1.5 (NEEDLE) ×2 IMPLANT
PACK BASIN DAY SURGERY FS (CUSTOM PROCEDURE TRAY) ×2 IMPLANT
PENCIL BUTTON HOLSTER BLD 10FT (ELECTRODE) ×2 IMPLANT
SHEET MEDIUM DRAPE 40X70 STRL (DRAPES) IMPLANT
SLEEVE SCD COMPRESS KNEE MED (MISCELLANEOUS) ×2 IMPLANT
SPONGE GAUZE 2X2 8PLY STRL LF (GAUZE/BANDAGES/DRESSINGS) IMPLANT
STAPLER VISISTAT 35W (STAPLE) ×2 IMPLANT
STRIP CLOSURE SKIN 1/2X4 (GAUZE/BANDAGES/DRESSINGS) IMPLANT
STRIP CLOSURE SKIN 1/4X4 (GAUZE/BANDAGES/DRESSINGS) IMPLANT
SUT MNCRL AB 3-0 PS2 18 (SUTURE) ×4 IMPLANT
SUT MNCRL AB 4-0 PS2 18 (SUTURE) ×2 IMPLANT
SUT PLAIN 5 0 P 3 18 (SUTURE) IMPLANT
SUT PROLENE 5 0 P 3 (SUTURE) IMPLANT
SUT PROLENE 5 0 PS 2 (SUTURE) IMPLANT
SUT PROLENE 6 0 P 1 18 (SUTURE) IMPLANT
SUT VIC AB 3-0 SH 27 (SUTURE) ×1
SUT VIC AB 3-0 SH 27X BRD (SUTURE) ×1 IMPLANT
SYR BULB 3OZ (MISCELLANEOUS) IMPLANT
SYR CONTROL 10ML LL (SYRINGE) ×2 IMPLANT
TOWEL OR 17X24 6PK STRL BLUE (TOWEL DISPOSABLE) ×2 IMPLANT
TRAY DSU PREP LF (CUSTOM PROCEDURE TRAY) IMPLANT

## 2016-10-22 NOTE — Anesthesia Postprocedure Evaluation (Signed)
Anesthesia Post Note  Patient: Catherine Leon  Procedure(s) Performed: Procedure(s) (LRB): COMPLEX REPAIR LEFT CHEST 10 CM (Left)  Patient location during evaluation: PACU Anesthesia Type: General Level of consciousness: awake and alert Pain management: pain level controlled Vital Signs Assessment: post-procedure vital signs reviewed and stable Respiratory status: spontaneous breathing, nonlabored ventilation, respiratory function stable and patient connected to nasal cannula oxygen Cardiovascular status: blood pressure returned to baseline and stable Postop Assessment: no signs of nausea or vomiting Anesthetic complications: no       Last Vitals:  Vitals:   10/22/16 1300 10/22/16 1338  BP: (!) 153/76   Pulse: (!) 58 (!) 104  Resp: 17 18  Temp:  36.4 C    Last Pain:  Vitals:   10/22/16 1338  TempSrc:   PainSc: 0-No pain                 Catalina Gravel

## 2016-10-22 NOTE — Transfer of Care (Signed)
Immediate Anesthesia Transfer of Care Note  Patient: Catherine Leon  Procedure(s) Performed: Procedure(s): COMPLEX REPAIR LEFT CHEST 10 CM (Left)  Patient Location: PACU  Anesthesia Type:General  Level of Consciousness: awake, alert  and oriented  Airway & Oxygen Therapy: Patient Spontanous Breathing and Patient connected to face mask oxygen  Post-op Assessment: Report given to RN and Post -op Vital signs reviewed and stable  Post vital signs: Reviewed and stable  Last Vitals:  Vitals:   10/22/16 1300 10/22/16 1338  BP: (!) 153/76   Pulse: (!) 58 (!) 104  Resp: 17 18  Temp:  36.4 C    Last Pain:  Vitals:   10/22/16 1338  TempSrc:   PainSc: 0-No pain      Patients Stated Pain Goal: 0 ([id number])  Complications: No apparent anesthesia complications

## 2016-10-22 NOTE — Discharge Instructions (Signed)

## 2016-10-22 NOTE — Anesthesia Procedure Notes (Signed)
Procedure Name: Intubation Date/Time: 10/22/2016 11:23 AM Performed by: Rayvon Char Pre-anesthesia Checklist: Patient identified, Emergency Drugs available, Suction available and Patient being monitored Patient Re-evaluated:Patient Re-evaluated prior to inductionOxygen Delivery Method: Circle system utilized Preoxygenation: Pre-oxygenation with 100% oxygen Intubation Type: IV induction Ventilation: Mask ventilation without difficulty Laryngoscope Size: Miller and 2 Grade View: Grade II Number of attempts: 1 Airway Equipment and Method: Stylet Placement Confirmation: ETT inserted through vocal cords under direct vision,  positive ETCO2 and breath sounds checked- equal and bilateral Secured at: 22 cm Tube secured with: Tape Dental Injury: Teeth and Oropharynx as per pre-operative assessment

## 2016-10-22 NOTE — Interval H&P Note (Signed)
History and Physical Interval Note:  10/22/2016 10:38 AM  Catherine Leon  has presented today for surgery, with the diagnosis of EXTRAVASATION, VESICANT CHEMOTHERAPY SEQUELA, HISTORY OF BREAST CANCER  The various methods of treatment have been discussed with the patient and family. After consideration of risks, benefits and other options for treatment, the patient has consented to  Procedure(s): COMPLEX REPAIR LEFT CHEST 10 CM (Left) as a surgical intervention .  The patient's history has been reviewed, patient examined, no change in status, stable for surgery.  I have reviewed the patient's chart and labs.  Questions were answered to the patient's satisfaction.     Nezar Buckles

## 2016-10-22 NOTE — Anesthesia Preprocedure Evaluation (Addendum)
Anesthesia Evaluation  Patient identified by MRN, date of birth, ID band Patient awake    Reviewed: Allergy & Precautions, NPO status , Patient's Chart, lab work & pertinent test results  Airway Mallampati: II  TM Distance: >3 FB Neck ROM: Full    Dental  (+) Dental Advisory Given, Edentulous Upper   Pulmonary former smoker,    Pulmonary exam normal breath sounds clear to auscultation       Cardiovascular hypertension, Pt. on medications (-) angina(-) CAD, (-) Past MI and (-) CHF Normal cardiovascular exam Rhythm:Regular Rate:Normal     Neuro/Psych negative neurological ROS  negative psych ROS   GI/Hepatic negative GI ROS, Neg liver ROS,   Endo/Other  diabetes, Type 2, Oral Hypoglycemic Agents  Renal/GU negative Renal ROS Bladder dysfunction      Musculoskeletal negative musculoskeletal ROS (+)   Abdominal   Peds  Hematology negative hematology ROS (+)   Anesthesia Other Findings Day of surgery medications reviewed with the patient.  EXTRAVASATION, VESICANT CHEMOTHERAPY SEQUELA, HISTORY OF BREAST CANCER  Reproductive/Obstetrics                            Anesthesia Physical Anesthesia Plan  ASA: II  Anesthesia Plan: General   Post-op Pain Management:    Induction: Intravenous  Airway Management Planned: Oral ETT  Additional Equipment:   Intra-op Plan:   Post-operative Plan: Extubation in OR  Informed Consent: I have reviewed the patients History and Physical, chart, labs and discussed the procedure including the risks, benefits and alternatives for the proposed anesthesia with the patient or authorized representative who has indicated his/her understanding and acceptance.   Dental advisory given  Plan Discussed with: CRNA  Anesthesia Plan Comments: (Risks/benefits of general anesthesia discussed with patient including risk of damage to teeth, lips, gum, and tongue,  nausea/vomiting, allergic reactions to medications, and the possibility of heart attack, stroke and death.  All patient questions answered.  Patient wishes to proceed.)        Anesthesia Quick Evaluation

## 2016-10-22 NOTE — Op Note (Signed)
Operative Note   DATE OF OPERATION: 2.6.18  LOCATION: [facility]-outpatient  SURGICAL DIVISION: Plastic Surgery  PREOPERATIVE DIAGNOSES:  1. Left chest extravasation chemotherapeutic agent 2. History right breast cancer 3. Scar left chest  POSTOPERATIVE DIAGNOSES:  same  PROCEDURE:  Complex repair left chest 10 cm  SURGEON: Irene Limbo MD MBA  ASSISTANT: none  ANESTHESIA:  General.   EBL: minimal  COMPLICATIONS: None immediate.   INDICATIONS FOR PROCEDURE:  The patient, Catherine Leon, is a 76 y.o. female born on 02-13-1941, is here for excision scar left chest incurred from chemotherapy extravasation.   FINDINGS: 9 x 5 cm area of scar left upper chest with indurated hypertrophic scar.  DESCRIPTION OF PROCEDURE:  The patient's operative site was marked with the patient in the preoperative area. The patient was taken to the operating room. SCDs were placed and IV antibiotics were given. The patient's operative site was prepped and draped in a sterile fashion. A time out was performed and all information was confirmed to be correct. Local anesthetic infiltrated surrounding the area of resection. Vertically oriented ellipse of scar and indurated tissue completed sharply with knife. Limited elevation of adjacent skin completed in subcutaneous plane to aid with closure. Wound irrigated and hemostasis ensured. Closure completed in layers with interrupted 3-0 vicryl in superficial fascia and dermis. Additional 4-0 vicryl placed in dermis followed by skin closure with running 4-0 monocryl subcuticular. Tissue adhesive applied followed by dry dressing.  The patient was allowed to wake from anesthesia, extubated and taken to the recovery room in satisfactory condition.   SPECIMENS: left chest scar  DRAINS: none  Irene Limbo, MD [facility] Plastic & Reconstructive Surgery [phone], pin [phone]

## 2016-10-23 ENCOUNTER — Encounter (HOSPITAL_BASED_OUTPATIENT_CLINIC_OR_DEPARTMENT_OTHER): Payer: Self-pay | Admitting: Plastic Surgery

## 2016-11-26 DIAGNOSIS — R112 Nausea with vomiting, unspecified: Secondary | ICD-10-CM | POA: Diagnosis not present

## 2016-12-12 DIAGNOSIS — Z853 Personal history of malignant neoplasm of breast: Secondary | ICD-10-CM | POA: Diagnosis not present

## 2016-12-12 DIAGNOSIS — T80810S Extravasation of vesicant antineoplastic chemotherapy, sequela: Secondary | ICD-10-CM | POA: Diagnosis not present

## 2016-12-12 DIAGNOSIS — L905 Scar conditions and fibrosis of skin: Secondary | ICD-10-CM | POA: Diagnosis not present

## 2017-01-23 DIAGNOSIS — L905 Scar conditions and fibrosis of skin: Secondary | ICD-10-CM | POA: Diagnosis not present

## 2017-01-23 DIAGNOSIS — Z853 Personal history of malignant neoplasm of breast: Secondary | ICD-10-CM | POA: Diagnosis not present

## 2017-01-23 DIAGNOSIS — T80810S Extravasation of vesicant antineoplastic chemotherapy, sequela: Secondary | ICD-10-CM | POA: Diagnosis not present

## 2017-05-21 ENCOUNTER — Other Ambulatory Visit: Payer: Self-pay | Admitting: General Surgery

## 2017-05-21 DIAGNOSIS — Z1231 Encounter for screening mammogram for malignant neoplasm of breast: Secondary | ICD-10-CM

## 2017-06-18 ENCOUNTER — Ambulatory Visit
Admission: RE | Admit: 2017-06-18 | Discharge: 2017-06-18 | Disposition: A | Discharge status: Home / Self Care | Payer: Medicare Other | Source: Ambulatory Visit | Attending: General Surgery | Admitting: General Surgery

## 2017-06-18 DIAGNOSIS — Z1231 Encounter for screening mammogram for malignant neoplasm of breast: Secondary | ICD-10-CM | POA: Diagnosis not present

## 2017-06-18 HISTORY — DX: Personal history of antineoplastic chemotherapy: Z92.21

## 2017-06-24 DIAGNOSIS — E1121 Type 2 diabetes mellitus with diabetic nephropathy: Secondary | ICD-10-CM | POA: Diagnosis not present

## 2017-06-24 DIAGNOSIS — Z0000 Encounter for general adult medical examination without abnormal findings: Secondary | ICD-10-CM | POA: Diagnosis not present

## 2017-06-24 DIAGNOSIS — I10 Essential (primary) hypertension: Secondary | ICD-10-CM | POA: Diagnosis not present

## 2017-06-24 DIAGNOSIS — N390 Urinary tract infection, site not specified: Secondary | ICD-10-CM | POA: Diagnosis not present

## 2017-06-24 DIAGNOSIS — E7800 Pure hypercholesterolemia, unspecified: Secondary | ICD-10-CM | POA: Diagnosis not present

## 2017-06-24 DIAGNOSIS — E559 Vitamin D deficiency, unspecified: Secondary | ICD-10-CM | POA: Diagnosis not present

## 2017-06-26 DIAGNOSIS — E7800 Pure hypercholesterolemia, unspecified: Secondary | ICD-10-CM | POA: Diagnosis not present

## 2017-06-26 DIAGNOSIS — E049 Nontoxic goiter, unspecified: Secondary | ICD-10-CM | POA: Diagnosis not present

## 2017-06-26 DIAGNOSIS — Z8679 Personal history of other diseases of the circulatory system: Secondary | ICD-10-CM | POA: Diagnosis not present

## 2017-06-26 DIAGNOSIS — D649 Anemia, unspecified: Secondary | ICD-10-CM | POA: Diagnosis not present

## 2017-06-26 DIAGNOSIS — Z6827 Body mass index (BMI) 27.0-27.9, adult: Secondary | ICD-10-CM | POA: Diagnosis not present

## 2017-06-26 DIAGNOSIS — R350 Frequency of micturition: Secondary | ICD-10-CM | POA: Diagnosis not present

## 2017-06-26 DIAGNOSIS — M1990 Unspecified osteoarthritis, unspecified site: Secondary | ICD-10-CM | POA: Diagnosis not present

## 2017-06-26 DIAGNOSIS — E162 Hypoglycemia, unspecified: Secondary | ICD-10-CM | POA: Diagnosis not present

## 2017-06-26 DIAGNOSIS — E1121 Type 2 diabetes mellitus with diabetic nephropathy: Secondary | ICD-10-CM | POA: Diagnosis not present

## 2017-06-26 DIAGNOSIS — R232 Flushing: Secondary | ICD-10-CM | POA: Diagnosis not present

## 2017-06-26 DIAGNOSIS — I10 Essential (primary) hypertension: Secondary | ICD-10-CM | POA: Diagnosis not present

## 2017-07-15 DIAGNOSIS — E119 Type 2 diabetes mellitus without complications: Secondary | ICD-10-CM | POA: Diagnosis not present

## 2017-07-24 DIAGNOSIS — D649 Anemia, unspecified: Secondary | ICD-10-CM | POA: Diagnosis not present

## 2017-09-30 DIAGNOSIS — E1121 Type 2 diabetes mellitus with diabetic nephropathy: Secondary | ICD-10-CM | POA: Diagnosis not present

## 2017-09-30 DIAGNOSIS — I10 Essential (primary) hypertension: Secondary | ICD-10-CM | POA: Diagnosis not present

## 2017-09-30 DIAGNOSIS — Z780 Asymptomatic menopausal state: Secondary | ICD-10-CM | POA: Diagnosis not present

## 2017-10-21 DIAGNOSIS — I10 Essential (primary) hypertension: Secondary | ICD-10-CM | POA: Diagnosis not present

## 2017-10-21 DIAGNOSIS — E119 Type 2 diabetes mellitus without complications: Secondary | ICD-10-CM | POA: Diagnosis not present

## 2017-10-21 DIAGNOSIS — C50911 Malignant neoplasm of unspecified site of right female breast: Secondary | ICD-10-CM | POA: Diagnosis not present

## 2018-01-27 DIAGNOSIS — E1121 Type 2 diabetes mellitus with diabetic nephropathy: Secondary | ICD-10-CM | POA: Diagnosis not present

## 2018-01-27 DIAGNOSIS — I10 Essential (primary) hypertension: Secondary | ICD-10-CM | POA: Diagnosis not present

## 2018-05-19 ENCOUNTER — Other Ambulatory Visit: Payer: Self-pay | Admitting: General Surgery

## 2018-05-19 DIAGNOSIS — Z1231 Encounter for screening mammogram for malignant neoplasm of breast: Secondary | ICD-10-CM

## 2018-06-25 DIAGNOSIS — I10 Essential (primary) hypertension: Secondary | ICD-10-CM | POA: Diagnosis not present

## 2018-06-25 DIAGNOSIS — D649 Anemia, unspecified: Secondary | ICD-10-CM | POA: Diagnosis not present

## 2018-06-25 DIAGNOSIS — E1121 Type 2 diabetes mellitus with diabetic nephropathy: Secondary | ICD-10-CM | POA: Diagnosis not present

## 2018-06-25 DIAGNOSIS — N390 Urinary tract infection, site not specified: Secondary | ICD-10-CM | POA: Diagnosis not present

## 2018-06-25 DIAGNOSIS — E7800 Pure hypercholesterolemia, unspecified: Secondary | ICD-10-CM | POA: Diagnosis not present

## 2018-06-25 DIAGNOSIS — E559 Vitamin D deficiency, unspecified: Secondary | ICD-10-CM | POA: Diagnosis not present

## 2018-06-30 DIAGNOSIS — E7800 Pure hypercholesterolemia, unspecified: Secondary | ICD-10-CM | POA: Diagnosis not present

## 2018-06-30 DIAGNOSIS — R232 Flushing: Secondary | ICD-10-CM | POA: Diagnosis not present

## 2018-06-30 DIAGNOSIS — E049 Nontoxic goiter, unspecified: Secondary | ICD-10-CM | POA: Diagnosis not present

## 2018-06-30 DIAGNOSIS — M1990 Unspecified osteoarthritis, unspecified site: Secondary | ICD-10-CM | POA: Diagnosis not present

## 2018-06-30 DIAGNOSIS — N183 Chronic kidney disease, stage 3 (moderate): Secondary | ICD-10-CM | POA: Diagnosis not present

## 2018-06-30 DIAGNOSIS — D649 Anemia, unspecified: Secondary | ICD-10-CM | POA: Diagnosis not present

## 2018-06-30 DIAGNOSIS — Z7982 Long term (current) use of aspirin: Secondary | ICD-10-CM | POA: Diagnosis not present

## 2018-06-30 DIAGNOSIS — Z0000 Encounter for general adult medical examination without abnormal findings: Secondary | ICD-10-CM | POA: Diagnosis not present

## 2018-06-30 DIAGNOSIS — I10 Essential (primary) hypertension: Secondary | ICD-10-CM | POA: Diagnosis not present

## 2018-06-30 DIAGNOSIS — E1121 Type 2 diabetes mellitus with diabetic nephropathy: Secondary | ICD-10-CM | POA: Diagnosis not present

## 2018-06-30 DIAGNOSIS — Z6827 Body mass index (BMI) 27.0-27.9, adult: Secondary | ICD-10-CM | POA: Diagnosis not present

## 2018-06-30 DIAGNOSIS — Z780 Asymptomatic menopausal state: Secondary | ICD-10-CM | POA: Diagnosis not present

## 2018-07-01 ENCOUNTER — Ambulatory Visit
Admission: RE | Admit: 2018-07-01 | Discharge: 2018-07-01 | Disposition: A | Discharge status: Home / Self Care | Payer: Medicare Other | Source: Ambulatory Visit | Attending: General Surgery | Admitting: General Surgery

## 2018-07-01 DIAGNOSIS — Z1231 Encounter for screening mammogram for malignant neoplasm of breast: Secondary | ICD-10-CM | POA: Diagnosis not present

## 2018-07-01 HISTORY — DX: Personal history of irradiation: Z92.3

## 2018-07-08 DIAGNOSIS — M79672 Pain in left foot: Secondary | ICD-10-CM | POA: Diagnosis not present

## 2018-07-08 DIAGNOSIS — B079 Viral wart, unspecified: Secondary | ICD-10-CM | POA: Diagnosis not present

## 2018-07-08 DIAGNOSIS — M21612 Bunion of left foot: Secondary | ICD-10-CM | POA: Diagnosis not present

## 2018-07-08 DIAGNOSIS — M79671 Pain in right foot: Secondary | ICD-10-CM | POA: Diagnosis not present

## 2018-07-29 DIAGNOSIS — B079 Viral wart, unspecified: Secondary | ICD-10-CM | POA: Diagnosis not present

## 2018-09-07 DIAGNOSIS — E119 Type 2 diabetes mellitus without complications: Secondary | ICD-10-CM | POA: Diagnosis not present

## 2018-09-30 DIAGNOSIS — I10 Essential (primary) hypertension: Secondary | ICD-10-CM | POA: Diagnosis not present

## 2018-09-30 DIAGNOSIS — E1121 Type 2 diabetes mellitus with diabetic nephropathy: Secondary | ICD-10-CM | POA: Diagnosis not present

## 2018-11-24 DIAGNOSIS — C50911 Malignant neoplasm of unspecified site of right female breast: Secondary | ICD-10-CM | POA: Diagnosis not present

## 2018-11-24 DIAGNOSIS — I10 Essential (primary) hypertension: Secondary | ICD-10-CM | POA: Diagnosis not present

## 2018-11-24 DIAGNOSIS — E119 Type 2 diabetes mellitus without complications: Secondary | ICD-10-CM | POA: Diagnosis not present

## 2019-04-16 ENCOUNTER — Other Ambulatory Visit: Payer: Self-pay

## 2019-05-27 ENCOUNTER — Other Ambulatory Visit: Payer: Self-pay | Admitting: General Surgery

## 2019-05-27 DIAGNOSIS — Z1231 Encounter for screening mammogram for malignant neoplasm of breast: Secondary | ICD-10-CM

## 2019-07-02 DIAGNOSIS — E7800 Pure hypercholesterolemia, unspecified: Secondary | ICD-10-CM | POA: Diagnosis not present

## 2019-07-02 DIAGNOSIS — I10 Essential (primary) hypertension: Secondary | ICD-10-CM | POA: Diagnosis not present

## 2019-07-02 DIAGNOSIS — E119 Type 2 diabetes mellitus without complications: Secondary | ICD-10-CM | POA: Diagnosis not present

## 2019-07-07 DIAGNOSIS — Z8249 Family history of ischemic heart disease and other diseases of the circulatory system: Secondary | ICD-10-CM | POA: Diagnosis not present

## 2019-07-07 DIAGNOSIS — Z1211 Encounter for screening for malignant neoplasm of colon: Secondary | ICD-10-CM | POA: Diagnosis not present

## 2019-07-07 DIAGNOSIS — Z923 Personal history of irradiation: Secondary | ICD-10-CM | POA: Diagnosis not present

## 2019-07-07 DIAGNOSIS — I10 Essential (primary) hypertension: Secondary | ICD-10-CM | POA: Diagnosis not present

## 2019-07-07 DIAGNOSIS — M1990 Unspecified osteoarthritis, unspecified site: Secondary | ICD-10-CM | POA: Diagnosis not present

## 2019-07-07 DIAGNOSIS — R350 Frequency of micturition: Secondary | ICD-10-CM | POA: Diagnosis not present

## 2019-07-07 DIAGNOSIS — E7800 Pure hypercholesterolemia, unspecified: Secondary | ICD-10-CM | POA: Diagnosis not present

## 2019-07-07 DIAGNOSIS — Z0000 Encounter for general adult medical examination without abnormal findings: Secondary | ICD-10-CM | POA: Diagnosis not present

## 2019-07-07 DIAGNOSIS — E1121 Type 2 diabetes mellitus with diabetic nephropathy: Secondary | ICD-10-CM | POA: Diagnosis not present

## 2019-07-15 ENCOUNTER — Other Ambulatory Visit: Payer: Self-pay

## 2019-07-15 ENCOUNTER — Ambulatory Visit
Admission: RE | Admit: 2019-07-15 | Discharge: 2019-07-15 | Disposition: A | Discharge status: Home / Self Care | Payer: Medicare Other | Source: Ambulatory Visit | Attending: General Surgery | Admitting: General Surgery

## 2019-07-15 DIAGNOSIS — Z1231 Encounter for screening mammogram for malignant neoplasm of breast: Secondary | ICD-10-CM

## 2019-07-27 DIAGNOSIS — Z1212 Encounter for screening for malignant neoplasm of rectum: Secondary | ICD-10-CM | POA: Diagnosis not present

## 2019-07-27 DIAGNOSIS — Z1211 Encounter for screening for malignant neoplasm of colon: Secondary | ICD-10-CM | POA: Diagnosis not present

## 2019-08-11 ENCOUNTER — Other Ambulatory Visit: Payer: Self-pay

## 2019-10-08 DIAGNOSIS — D649 Anemia, unspecified: Secondary | ICD-10-CM | POA: Diagnosis not present

## 2019-10-08 DIAGNOSIS — E119 Type 2 diabetes mellitus without complications: Secondary | ICD-10-CM | POA: Diagnosis not present

## 2019-10-13 DIAGNOSIS — E119 Type 2 diabetes mellitus without complications: Secondary | ICD-10-CM | POA: Diagnosis not present

## 2020-02-11 DIAGNOSIS — E1121 Type 2 diabetes mellitus with diabetic nephropathy: Secondary | ICD-10-CM | POA: Diagnosis not present

## 2020-02-16 DIAGNOSIS — E119 Type 2 diabetes mellitus without complications: Secondary | ICD-10-CM | POA: Diagnosis not present

## 2020-06-07 ENCOUNTER — Other Ambulatory Visit: Payer: Self-pay | Admitting: General Surgery

## 2020-06-07 DIAGNOSIS — Z1231 Encounter for screening mammogram for malignant neoplasm of breast: Secondary | ICD-10-CM

## 2020-06-19 DIAGNOSIS — I517 Cardiomegaly: Secondary | ICD-10-CM | POA: Diagnosis not present

## 2020-06-19 DIAGNOSIS — R634 Abnormal weight loss: Secondary | ICD-10-CM | POA: Diagnosis not present

## 2020-06-19 DIAGNOSIS — I10 Essential (primary) hypertension: Secondary | ICD-10-CM | POA: Diagnosis not present

## 2020-07-18 ENCOUNTER — Ambulatory Visit
Admission: RE | Admit: 2020-07-18 | Discharge: 2020-07-18 | Disposition: A | Discharge status: Home / Self Care | Payer: Medicare Other | Source: Ambulatory Visit | Attending: General Surgery | Admitting: General Surgery

## 2020-07-18 ENCOUNTER — Other Ambulatory Visit: Payer: Self-pay

## 2020-07-18 ENCOUNTER — Other Ambulatory Visit: Payer: Self-pay | Admitting: General Surgery

## 2020-07-18 DIAGNOSIS — Z1231 Encounter for screening mammogram for malignant neoplasm of breast: Secondary | ICD-10-CM

## 2020-07-21 ENCOUNTER — Telehealth: Payer: Self-pay | Admitting: *Deleted

## 2020-07-21 DIAGNOSIS — R109 Unspecified abdominal pain: Secondary | ICD-10-CM | POA: Diagnosis not present

## 2020-07-21 DIAGNOSIS — E1121 Type 2 diabetes mellitus with diabetic nephropathy: Secondary | ICD-10-CM | POA: Diagnosis not present

## 2020-07-21 DIAGNOSIS — N390 Urinary tract infection, site not specified: Secondary | ICD-10-CM | POA: Diagnosis not present

## 2020-07-21 DIAGNOSIS — R634 Abnormal weight loss: Secondary | ICD-10-CM | POA: Diagnosis not present

## 2020-07-21 DIAGNOSIS — R0602 Shortness of breath: Secondary | ICD-10-CM | POA: Diagnosis not present

## 2020-07-21 NOTE — Telephone Encounter (Signed)
NOTES FROM DR PHARR, [place]. [phone]. REFERRAL SENT TO SCHEDULING.

## 2020-07-27 ENCOUNTER — Ambulatory Visit (INDEPENDENT_AMBULATORY_CARE_PROVIDER_SITE_OTHER): Payer: Medicare Other | Admitting: Cardiology

## 2020-07-27 ENCOUNTER — Encounter: Payer: Self-pay | Admitting: Cardiology

## 2020-07-27 ENCOUNTER — Other Ambulatory Visit: Payer: Self-pay

## 2020-07-27 VITALS — BP 134/80 | HR 59 | Ht 66.0 in | Wt 141.0 lb

## 2020-07-27 DIAGNOSIS — R072 Precordial pain: Secondary | ICD-10-CM | POA: Diagnosis not present

## 2020-07-27 DIAGNOSIS — I2089 Other forms of angina pectoris: Secondary | ICD-10-CM

## 2020-07-27 DIAGNOSIS — R0602 Shortness of breath: Secondary | ICD-10-CM | POA: Diagnosis not present

## 2020-07-27 DIAGNOSIS — Z01812 Encounter for preprocedural laboratory examination: Secondary | ICD-10-CM | POA: Diagnosis not present

## 2020-07-27 DIAGNOSIS — I208 Other forms of angina pectoris: Secondary | ICD-10-CM | POA: Diagnosis not present

## 2020-07-27 DIAGNOSIS — R0600 Dyspnea, unspecified: Secondary | ICD-10-CM | POA: Diagnosis not present

## 2020-07-27 DIAGNOSIS — R0609 Other forms of dyspnea: Secondary | ICD-10-CM

## 2020-07-27 DIAGNOSIS — R931 Abnormal findings on diagnostic imaging of heart and coronary circulation: Secondary | ICD-10-CM

## 2020-07-27 MED ORDER — METOPROLOL TARTRATE 50 MG PO TABS: 50.0000 mg | ORAL_TABLET | ORAL | 0 refills | Status: DC

## 2020-07-27 MED ORDER — METOPROLOL TARTRATE 50 MG PO TABS: 50.0000 mg | ORAL_TABLET | Freq: Once | ORAL | 0 refills | Status: DC

## 2020-07-27 NOTE — Patient Instructions (Signed)
Medication Instructions:  The current medical regimen is effective;  continue present plan and medications.  *If you need a refill on your cardiac medications before your next appointment, please call your pharmacy*   Lab Work: You will need lab work before your Coronary CT.  This will be scheduled once the CT has been scheduled.  If you have labs (blood work) drawn today and your tests are completely normal, you will receive your results only by: [name] MyChart Message (if you have MyChart) OR . A paper copy in the mail If you have any lab test that is abnormal or we need to change your treatment, we will call you to review the results.   Testing/Procedures: Your cardiac CT will be scheduled at:   [facility] [address] [place], [place] [address] [phone]  Please arrive at the [facility] main entrance of [facility] 30 minutes prior to test start time. Proceed to the [facility] Radiology Department (first floor) to check-in and test prep.  Please follow these instructions carefully (unless otherwise directed):  On the Night Before the Test: . Be sure to Drink plenty of water. . Do not consume any caffeinated/decaffeinated beverages or chocolate 12 hours prior to your test. . Do not take any antihistamines 12 hours prior to your test.  On the Day of the Test: . Drink plenty of water. Do not drink any water within one hour of the test. . Do not eat any food 4 hours prior to the test. . You may take your regular medications prior to the test.  . Take metoprolol (Lopressor) two hours prior to test. . HOLD Furosemide/Hydrochlorothiazide morning of the test. . FEMALES- please wear underwire-free bra if available      After the Test: . Drink plenty of water. . After receiving IV contrast, you may experience a mild flushed feeling. This is normal. . On occasion, you may experience a mild rash up to 24 hours after the test. This is not dangerous. If this  occurs, you can take Benadryl 25 mg and increase your fluid intake. . If you experience trouble breathing, this can be serious. If it is severe call 911 IMMEDIATELY. If it is mild, please call our office. . If you take any of these medications: Glipizide/Metformin, Avandament, Glucavance, please do not take 48 hours after completing test unless otherwise instructed.   Once we have confirmed authorization from your insurance company, we will call you to set up a date and time for your test. Based on how quickly your insurance processes prior authorizations requests, please allow up to 4 weeks to be contacted for scheduling your Cardiac CT appointment. Be advised that routine Cardiac CT appointments could be scheduled as many as 8 weeks after your provider has ordered it.  For non-scheduling related questions, please contact the cardiac imaging nurse navigator should you have any questions/concerns: Marchia Bond, Cardiac Imaging Nurse Navigator Burley Saver, Interim Cardiac Imaging Nurse [place] and V[facility] Services Direct Office Dial: [phone]   For scheduling needs, including cancellations and rescheduling, please call [place], [phone] (temporary number).    Follow-Up: At [facility], you and your health needs are our priority.  As part of our continuing mission to provide you with exceptional heart care, we have created designated Provider Care Teams.  These Care Teams include your primary Cardiologist (physician) and Advanced Practice Providers (APPs -  Physician Assistants and Nurse Practitioners) who all work together to provide you with  the care you need, when you need it.  We recommend signing up for the patient portal called "MyChart".  Sign up information is provided on this After Visit Summary.  MyChart is used to connect with patients for Virtual Visits (Telemedicine).  Patients are able to view lab/test results, encounter notes, upcoming appointments, etc.   Non-urgent messages can be sent to your provider as well.   To learn more about what you can do with MyChart, go to [email or web address].    Your next appointment:   2 month(s)  The format for your next appointment:   In Person  Provider:   Candee Furbish, MD   Thank you for choosing [facility]!!

## 2020-07-27 NOTE — Progress Notes (Addendum)
Cardiology Office Note:    Date:  07/27/2020   ID:  Catherine Leon, DOB 10-18-1940, MRN [id number]  PCP:  Deland Pretty, MD  [facility] Cardiologist:  Candee Furbish, MD  [place] Electrophysiologist:  None   Referring MD: Deland Pretty, MD     History of Present Illness:    Catherine Leon is a 79 y.o. female here for the evaluation of shortness of breath at the request of Dr. Shelia Media.  Her husband has seen Dr. Daneen Schick in the past.  Has a past medical history of hypertension type 2 diabetes hyperlipidemia and family history of coronary disease diabetes and sickle cell.  She had 2 major episodes where she felt extremely short of breath.  One episode occurred when she was rushing to go to Bible study.  Walked about 100 feet from her car got into the room and felt winded.  People wondered what was wrong.  Second episode occurred when she was walking to the mailbox.  Felt quite winded.  Denies any overt chest pressure or pain.  No syncope no bleeding no orthopnea no PND.  As an aside, she also started to lose some weight recently.  She went from 160 pounds down to 141.  Seems to be eating less but is not trying to lose weight.  She is worried because her brother lost weight before he was officially diagnosed with dilated cardiomyopathy with an EF of 25%.  Occasionally when getting out of bed too quickly she may feel dizziness.  She has had a prior history of palpitations and PVCs in the past.  Her mother died of coronary disease at age 3.  EKG from outside office shows sinus rhythm with poor R wave progression.  Hemoglobin A1c 6.9 creatinine 1.0 TSH 1.09 hemoglobin 11.7  A chest x-ray was also performed that demonstrated cardiomegaly.  Aortic atherosclerosis.  TSH was normal 1.09  Past Medical History:  Diagnosis Date  . Anemia   . BMI 27.0-27.9,adult   . Breast cancer ([place])   . Cancer ([place])   . Cardiac dysrhythmia   . CKD (chronic kidney disease)   . Constipation    . Contact lens/glasses fitting    wears contacts or glasses  . Diabetes mellitus   . Family history of early CAD   . Flank pain   . Foot pain   . Goiter   . History of chemotherapy   . Hot flashes   . Hx of breast cancer 05/04/2012  . Hx of radiation therapy   . Hyperlipidemia   . Hypertension   . Osteoarthritis   . Palpitations   . Personal history of chemotherapy 2001  . Personal history of radiation therapy 2001  . Post-menopausal   . Sickle cell anemia ([place])   . Urinary frequency   . Wears partial dentures    partial top  . Weight loss     Past Surgical History:  Procedure Laterality Date  . BREAST LUMPECTOMY Right 2001  . BREAST SURGERY  07/16/2000   rt lump-axillary dissectopn  . CHOLECYSTECTOMY  1995 - approximate  . COLONOSCOPY    . LESION EXCISION WITH COMPLEX REPAIR Left 10/22/2016   Procedure: COMPLEX REPAIR LEFT CHEST 10 CM;  Surgeon: Irene Limbo, MD;  Location: [facility];  Service: Plastics;  Laterality: Left;  [name] MASS EXCISION Right 06/15/2013   Procedure: EXCISION right neck MASS;  Surgeon: Adin Hector, MD;  Location: [facility];  Service: General;  Laterality:  Right;  [name] MASTECTOMY  12/11/2009   RIGHT BREAST  . TONSILLECTOMY      Current Medications: Current Meds  Medication Sig  . atorvastatin (LIPITOR) 10 MG tablet Take 10 mg by mouth daily.  [name] losartan-hydrochlorothiazide (HYZAAR) 100-25 MG per tablet every evening.   . metFORMIN (GLUCOPHAGE-XR) 750 MG 24 hr tablet Take 750 mg by mouth daily.  [name] oxybutynin (DITROPAN) 5 MG tablet Take 5 mg by mouth 2 (two) times daily.  . rosuvastatin (CRESTOR) 5 MG tablet Take 5 mg by mouth daily.      Allergies:   Patient has no known allergies.   Social History   Socioeconomic History  . Marital status: Married    Spouse name: Not on file  . Number of children: Not on file  . Years of education: Not on file  . Highest education level: Not on file  Occupational  History  . Not on file  Tobacco Use  . Smoking status: Former Smoker    Quit date: 11/04/1982    Years since quitting: 37.7  . Smokeless tobacco: Never Used  Substance and Sexual Activity  . Alcohol use: Yes    Comment: WINE. AVERAGE 1 GLASS A MONTH  . Drug use: No  . Sexual activity: Not on file  Other Topics Concern  . Not on file  Social History Narrative  . Not on file   Social Determinants of Health   Financial Resource Strain:   . Difficulty of Paying Living Expenses: Not on file  Food Insecurity:   . Worried About Charity fundraiser in the Last Year: Not on file  . Ran Out of Food in the Last Year: Not on file  Transportation Needs:   . Lack of Transportation (Medical): Not on file  . Lack of Transportation (Non-Medical): Not on file  Physical Activity:   . Days of Exercise per Week: Not on file  . Minutes of Exercise per Session: Not on file  Stress:   . Feeling of Stress : Not on file  Social Connections:   . Frequency of Communication with Friends and Family: Not on file  . Frequency of Social Gatherings with Friends and Family: Not on file  . Attends Religious Services: Not on file  . Active Member of Clubs or Organizations: Not on file  . Attends Archivist Meetings: Not on file  . Marital Status: Not on file     Family History: The patient's family history includes Breast cancer in her paternal aunt; Cancer in her paternal aunt; Diabetes in her father; Heart disease in her mother; Kidney disease in her father.  ROS:   Please see the history of present illness.     All other systems reviewed and are negative.  EKGs/Labs/Other Studies Reviewed:    The following studies were reviewed today: Office notes chest x-ray EKG  Addendum: Echocardiogram report obtained from 07/27/2020, [place] read by Dr. Pamelia Hoit, LV chamber and thickness normal.  There was segmental wall motion abnormality.  Inferior posterior hypokinesis.  Left ventricular EF  estimate low normal to mildly depressed.  EF by 2D estimate 42%.  Right ventricle was normal left atrium was mildly dilated 40.96 mL/m.  Right atrium was normal.  Aortic valve sclerosis was noted with mild aortic regurgitation.  Mild mitral regurgitation.  Trace tricuspid regurgitation.  Pericardium was normal.  IVC was normal.  Candee Furbish, MD   EKG:  EKG is  ordered today.  The ekg ordered today demonstrates sinus  rhythm/sinus bradycardia rate 59 with left atrial enlargement left axis deviation nonspecific ST-T wave changes.  Recent Labs: No results found for requested labs within last 8760 hours.  Recent Lipid Panel No results found for: CHOL, TRIG, HDL, CHOLHDL, VLDL, LDLCALC, LDLDIRECT   Physical Exam:    VS:  BP 134/80   Pulse (!) 59   Ht _0  (1.676 m)   Wt 141 lb (64 kg)   SpO2 97%   BMI 22.76 kg/m     Wt Readings from Last 3 Encounters:  07/27/20 141 lb (64 kg)  10/22/16 164 lb 9.6 oz (74.7 kg)  10/17/13 173 lb (78.5 kg)     GEN:  Well nourished, well developed in no acute distress HEENT: Normal NECK: No JVD; No carotid bruits LYMPHATICS: No lymphadenopathy CARDIAC: RRR, no murmurs, rubs, gallops RESPIRATORY:  Clear to auscultation without rales, wheezing or rhonchi  ABDOMEN: Soft, non-tender, non-distended MUSCULOSKELETAL:  No edema; No deformity  SKIN: Warm and dry NEUROLOGIC:  Alert and oriented x 3 PSYCHIATRIC:  Normal affect   ASSESSMENT:    1. Dyspnea on exertion   2. Angina of effort ([place])   3. Precordial pain   4. Pre-procedure lab exam   5. Abnormal findings on diagnostic imaging of heart and coronary circulation     PLAN:    In order of problems listed above:  Shortness of breath with exertion -May be her anginal equivalent.  Abnormal EKG noted.  Aortic atherosclerosis reported out on chest x-ray -We will go ahead and proceed with coronary CT scan with possible FFR analysis. -She states that she had an echocardiogram performed yesterday at  Dr. Pennie Banter office.  Unfortunately I do not have these results with me today.  We will call his office to have this forwarded.  Abnormal EKG -As above.  Nonspecific changes.   Shared Decision Making/Informed Consent      Medication Adjustments/Labs and Tests Ordered: Current medicines are reviewed at length with the patient today.  Concerns regarding medicines are outlined above.  Orders Placed This Encounter  Procedures  . CT CORONARY MORPH W/CTA COR W/SCORE W/CA W/CM &/OR WO/CM  . CT CORONARY FRACTIONAL FLOW RESERVE DATA PREP  . CT CORONARY FRACTIONAL FLOW RESERVE FLUID ANALYSIS  . Basic metabolic panel  . EKG 12-Lead   Meds ordered this encounter  Medications  . DISCONTD: metoprolol tartrate (LOPRESSOR) 50 MG tablet    Sig: Take 1 tablet (50 mg total) by mouth as directed. Take (1) tablet 2 hours before your coronary CT.    Dispense:  1 tablet    Refill:  0  . metoprolol tartrate (LOPRESSOR) 50 MG tablet    Sig: Take 1 tablet (50 mg total) by mouth once for 1 dose. Take one tablet (2) hours before your CT scan    Dispense:  1 tablet    Refill:  0    Patient Instructions  Medication Instructions:  The current medical regimen is effective;  continue present plan and medications.  *If you need a refill on your cardiac medications before your next appointment, please call your pharmacy*   Lab Work: You will need lab work before your Coronary CT.  This will be scheduled once the CT has been scheduled.  If you have labs (blood work) drawn today and your tests are completely normal, you will receive your results only by: [name] MyChart Message (if you have MyChart) OR . A paper copy in the mail If you have any lab test that  is abnormal or we need to change your treatment, we will call you to review the results.   Testing/Procedures: Your cardiac CT will be scheduled at:   [facility] [address] [place], [place] [address] [phone]  Please arrive at  the [facility] main entrance of [facility] 30 minutes prior to test start time. Proceed to the [facility] Radiology Department (first floor) to check-in and test prep.  Please follow these instructions carefully (unless otherwise directed):  On the Night Before the Test: . Be sure to Drink plenty of water. . Do not consume any caffeinated/decaffeinated beverages or chocolate 12 hours prior to your test. . Do not take any antihistamines 12 hours prior to your test.  On the Day of the Test: . Drink plenty of water. Do not drink any water within one hour of the test. . Do not eat any food 4 hours prior to the test. . You may take your regular medications prior to the test.  . Take metoprolol (Lopressor) two hours prior to test. . HOLD Furosemide/Hydrochlorothiazide morning of the test. . FEMALES- please wear underwire-free bra if available      After the Test: . Drink plenty of water. . After receiving IV contrast, you may experience a mild flushed feeling. This is normal. . On occasion, you may experience a mild rash up to 24 hours after the test. This is not dangerous. If this occurs, you can take Benadryl 25 mg and increase your fluid intake. . If you experience trouble breathing, this can be serious. If it is severe call 911 IMMEDIATELY. If it is mild, please call our office. . If you take any of these medications: Glipizide/Metformin, Avandament, Glucavance, please do not take 48 hours after completing test unless otherwise instructed.   Once we have confirmed authorization from your insurance company, we will call you to set up a date and time for your test. Based on how quickly your insurance processes prior authorizations requests, please allow up to 4 weeks to be contacted for scheduling your Cardiac CT appointment. Be advised that routine Cardiac CT appointments could be scheduled as many as 8 weeks after your provider has ordered it.  For non-scheduling related  questions, please contact the cardiac imaging nurse navigator should you have any questions/concerns: Marchia Bond, Cardiac Imaging Nurse Navigator Burley Saver, Interim Cardiac Imaging Nurse [place] and Vascular Services Direct Office Dial: [phone]   For scheduling needs, including cancellations and rescheduling, please call [place], [phone] (temporary number).    Follow-Up: At [facility], you and your health needs are our priority.  As part of our continuing mission to provide you with exceptional heart care, we have created designated Provider Care Teams.  These Care Teams include your primary Cardiologist (physician) and Advanced Practice Providers (APPs -  Physician Assistants and Nurse Practitioners) who all work together to provide you with the care you need, when you need it.  We recommend signing up for the patient portal called "MyChart".  Sign up information is provided on this After Visit Summary.  MyChart is used to connect with patients for Virtual Visits (Telemedicine).  Patients are able to view lab/test results, encounter notes, upcoming appointments, etc.  Non-urgent messages can be sent to your provider as well.   To learn more about what you can do with MyChart, go to [email or web address].    Your next appointment:   2 month(s)  The format for your next appointment:  In Person  Provider:   Candee Furbish, MD   Thank you for choosing [facility]!!         Signed, Candee Furbish, MD  07/27/2020 4:58 PM    [place]

## 2020-08-03 ENCOUNTER — Telehealth: Payer: Self-pay | Admitting: *Deleted

## 2020-08-03 DIAGNOSIS — R072 Precordial pain: Secondary | ICD-10-CM

## 2020-08-03 DIAGNOSIS — Z01812 Encounter for preprocedural laboratory examination: Secondary | ICD-10-CM

## 2020-08-03 NOTE — Telephone Encounter (Signed)
Sharyne Peach, RN; Zeb Comfort, RN; Shellia Cleverly, RN Schedule 11/26 @ 10:00   She will need labs done for this scan she is aware also.   Thanks,  [place]    Order placed for [employer] and scheduled with pt for 11/23.  She is aware of location.

## 2020-08-08 ENCOUNTER — Telehealth (HOSPITAL_COMMUNITY): Payer: Self-pay | Admitting: Emergency Medicine

## 2020-08-08 ENCOUNTER — Other Ambulatory Visit: Payer: Medicare Other | Admitting: *Deleted

## 2020-08-08 ENCOUNTER — Other Ambulatory Visit: Payer: Self-pay

## 2020-08-08 DIAGNOSIS — R072 Precordial pain: Secondary | ICD-10-CM | POA: Diagnosis not present

## 2020-08-08 DIAGNOSIS — Z01812 Encounter for preprocedural laboratory examination: Secondary | ICD-10-CM | POA: Diagnosis not present

## 2020-08-08 LAB — BASIC METABOLIC PANEL
BUN/Creatinine Ratio: 8 — ABNORMAL LOW (ref 12–28)
BUN: 7 mg/dL — ABNORMAL LOW (ref 8–27)
CO2: 23 mmol/L (ref 20–29)
Calcium: 9.4 mg/dL (ref 8.7–10.3)
Chloride: 107 mmol/L — ABNORMAL HIGH (ref 96–106)
Creatinine, Ser: 0.93 mg/dL (ref 0.57–1.00)
GFR calc Af Amer: 68 mL/min/{1.73_m2} (ref >59)
GFR calc non Af Amer: 59 mL/min/{1.73_m2} — ABNORMAL LOW (ref >59)
Glucose: 125 mg/dL — ABNORMAL HIGH (ref 65–99)
Potassium: 3.9 mmol/L (ref 3.5–5.2)
Sodium: 141 mmol/L (ref 134–144)

## 2020-08-08 NOTE — Telephone Encounter (Signed)
Attempted to call patient regarding upcoming cardiac CT appointment. Left message on voicemail with name and callback number Marchia Bond RN Navigator Cardiac Imaging [facility] [phone] Office [phone] Cell  Requested patient have labs drawn prior to scan

## 2020-08-09 ENCOUNTER — Telehealth (HOSPITAL_COMMUNITY): Payer: Self-pay | Admitting: Emergency Medicine

## 2020-08-09 NOTE — Telephone Encounter (Signed)
Reaching out to patient to offer assistance regarding upcoming cardiac imaging study; pt verbalizes understanding of appt date/time, parking situation and where to check in, pre-test NPO status and medications ordered, and verified current allergies; name and call back number provided for further questions should they arise Xerxes Agrusa RN Navigator Cardiac Imaging [facility] [phone] office [phone] cell  50mg metoprolol tartrate 2 hr prior to scan  

## 2020-08-11 ENCOUNTER — Ambulatory Visit (HOSPITAL_COMMUNITY)
Admission: RE | Admit: 2020-08-11 | Discharge: 2020-08-11 | Disposition: A | Discharge status: Home / Self Care | Payer: Medicare Other | Source: Ambulatory Visit | Attending: Cardiology | Admitting: Cardiology

## 2020-08-11 ENCOUNTER — Other Ambulatory Visit: Payer: Self-pay

## 2020-08-11 DIAGNOSIS — R072 Precordial pain: Secondary | ICD-10-CM | POA: Insufficient documentation

## 2020-08-11 MED ORDER — NITROGLYCERIN 0.4 MG SL SUBL
SUBLINGUAL_TABLET | SUBLINGUAL | Status: AC
  Filled 2020-08-11: qty 2

## 2020-08-11 MED ORDER — IOHEXOL 350 MG/ML SOLN
80.0000 mL | Freq: Once | INTRAVENOUS | Status: AC | PRN
  Administered 2020-08-11: 80 mL via INTRAVENOUS

## 2020-08-11 MED ORDER — NITROGLYCERIN 0.4 MG SL SUBL
0.8000 mg | SUBLINGUAL_TABLET | Freq: Once | SUBLINGUAL | Status: AC
  Administered 2020-08-11: 0.8 mg via SUBLINGUAL

## 2020-08-17 ENCOUNTER — Ambulatory Visit (INDEPENDENT_AMBULATORY_CARE_PROVIDER_SITE_OTHER): Payer: Medicare Other | Admitting: Cardiology

## 2020-08-17 ENCOUNTER — Other Ambulatory Visit: Payer: Self-pay

## 2020-08-17 ENCOUNTER — Encounter: Payer: Self-pay | Admitting: Cardiology

## 2020-08-17 VITALS — BP 140/80 | HR 70 | Ht 66.0 in | Wt 140.0 lb

## 2020-08-17 DIAGNOSIS — I2510 Atherosclerotic heart disease of native coronary artery without angina pectoris: Secondary | ICD-10-CM

## 2020-08-17 DIAGNOSIS — Z79899 Other long term (current) drug therapy: Secondary | ICD-10-CM | POA: Diagnosis not present

## 2020-08-17 DIAGNOSIS — I5023 Acute on chronic systolic (congestive) heart failure: Secondary | ICD-10-CM | POA: Diagnosis not present

## 2020-08-17 DIAGNOSIS — I208 Other forms of angina pectoris: Secondary | ICD-10-CM | POA: Diagnosis not present

## 2020-08-17 MED ORDER — SACUBITRIL-VALSARTAN 49-51 MG PO TABS: 1.0000 | ORAL_TABLET | Freq: Two times a day (BID) | ORAL | 11 refills | Status: DC

## 2020-08-17 MED ORDER — ROSUVASTATIN CALCIUM 10 MG PO TABS: 10.0000 mg | ORAL_TABLET | Freq: Every day | ORAL | 11 refills | Status: DC

## 2020-08-17 NOTE — Progress Notes (Signed)
Cardiology Office Note:    Date:  08/17/2020   ID:  Catherine Leon, DOB 10-04-1940, MRN [id number]  PCP:  Deland Pretty, MD  [facility] Cardiologist:  Candee Furbish, MD  [place] Electrophysiologist:  None   Referring MD: Deland Pretty, MD    History of Present Illness:    Catherine Leon is a 79 y.o. female here for follow-up of shortness of breath.  A CT scan of her coronary arteries was performed on 08/11/2020: IMPRESSION: 1. Coronary calcium score of 26. This was 70 percentile for age and sex matched control.  2. Normal coronary origin with left dominance.  3. CAD-RADS 2. Mild non-obstructive CAD (25-49%). Consider non-atherosclerotic causes of chest pain. Consider preventive therapy and risk factor modification.  EF reduced at 42% from outside office echo cardiogram report.  Nonflow limiting coronary plaque noted (there are no blockages that would require stenting) Repeat lipid panel in 3 months with ALT.  Outside echocardiogram report reviewed. Ejection fraction moderately reduced at 42%. Unfortunately I am not able to visualize images since it was performed at outside source.  Based upon this reduction and pump function, I would like for her to stop the losartan hydrochlorothiazide 100/25 once a day and start Entresto 49/51 mg p.o. twice a day.  Currently feels shortness of breath with activity.  None at rest.  No orthopnea.  No chest pain.  Past Medical History:  Diagnosis Date  . Anemia   . BMI 27.0-27.9,adult   . Breast cancer ([place])   . Cancer ([place])   . Cardiac dysrhythmia   . CKD (chronic kidney disease)   . Constipation   . Contact lens/glasses fitting    wears contacts or glasses  . Diabetes mellitus   . Family history of early CAD   . Flank pain   . Foot pain   . Goiter   . History of chemotherapy   . Hot flashes   . Hx of breast cancer 05/04/2012  . Hx of radiation therapy   . Hyperlipidemia   . Hypertension   . Osteoarthritis     . Palpitations   . Personal history of chemotherapy 2001  . Personal history of radiation therapy 2001  . Post-menopausal   . Sickle cell anemia ([place])   . Urinary frequency   . Wears partial dentures    partial top  . Weight loss     Past Surgical History:  Procedure Laterality Date  . BREAST LUMPECTOMY Right 2001  . BREAST SURGERY  07/16/2000   rt lump-axillary dissectopn  . CHOLECYSTECTOMY  1995 - approximate  . COLONOSCOPY    . LESION EXCISION WITH COMPLEX REPAIR Left 10/22/2016   Procedure: COMPLEX REPAIR LEFT CHEST 10 CM;  Surgeon: Irene Limbo, MD;  Location: [place];  Service: Plastics;  Laterality: Left;  [name] MASS EXCISION Right 06/15/2013   Procedure: EXCISION right neck MASS;  Surgeon: Adin Hector, MD;  Location: [place];  Service: General;  Laterality: Right;  . MASTECTOMY  12/11/2009   RIGHT BREAST  . TONSILLECTOMY      Current Medications: Current Meds  Medication Sig  . metFORMIN (GLUCOPHAGE-XR) 750 MG 24 hr tablet Take 750 mg by mouth daily.  [name] oxybutynin (DITROPAN) 5 MG tablet Take 5 mg by mouth 2 (two) times daily.  . [DISCONTINUED] atorvastatin (LIPITOR) 10 MG tablet Take 10 mg by mouth daily.  . [DISCONTINUED] losartan-hydrochlorothiazide (HYZAAR) 100-25 MG per tablet every evening.  Allergies:   Patient has no known allergies.   Social History   Socioeconomic History  . Marital status: Married    Spouse name: Not on file  . Number of children: Not on file  . Years of education: Not on file  . Highest education level: Not on file  Occupational History  . Not on file  Tobacco Use  . Smoking status: Former Smoker    Quit date: 11/04/1982    Years since quitting: 37.8  . Smokeless tobacco: Never Used  Substance and Sexual Activity  . Alcohol use: Yes    Comment: WINE. AVERAGE 1 GLASS A MONTH  . Drug use: No  . Sexual activity: Not on file  Other Topics Concern  . Not on file  Social History  Narrative  . Not on file   Social Determinants of Health   Financial Resource Strain:   . Difficulty of Paying Living Expenses: Not on file  Food Insecurity:   . Worried About Charity fundraiser in the Last Year: Not on file  . Ran Out of Food in the Last Year: Not on file  Transportation Needs:   . Lack of Transportation (Medical): Not on file  . Lack of Transportation (Non-Medical): Not on file  Physical Activity:   . Days of Exercise per Week: Not on file  . Minutes of Exercise per Session: Not on file  Stress:   . Feeling of Stress : Not on file  Social Connections:   . Frequency of Communication with Friends and Family: Not on file  . Frequency of Social Gatherings with Friends and Family: Not on file  . Attends Religious Services: Not on file  . Active Member of Clubs or Organizations: Not on file  . Attends Archivist Meetings: Not on file  . Marital Status: Not on file     Family History: The patient's family history includes Breast cancer in her paternal aunt; Cancer in her paternal aunt; Diabetes in her father; Heart disease in her mother; Kidney disease in her father.  ROS:   Please see the history of present illness.     All other systems reviewed and are negative.  EKGs/Labs/Other Studies Reviewed:    The following studies were reviewed today: As above    Recent Labs: 08/08/2020: BUN 7; Creatinine, Ser 0.93; Potassium 3.9; Sodium 141  Recent Lipid Panel No results found for: CHOL, TRIG, HDL, CHOLHDL, VLDL, LDLCALC, LDLDIRECT   Risk Assessment/Calculations:      Physical Exam:    VS:  BP 140/80   Pulse 70   Ht 5\' 6"  (1.676 m)   Wt 140 lb (63.5 kg)   SpO2 98%   BMI 22.60 kg/m     Wt Readings from Last 3 Encounters:  08/17/20 140 lb (63.5 kg)  07/27/20 141 lb (64 kg)  10/22/16 164 lb 9.6 oz (74.7 kg)     GEN:  Well nourished, well developed in no acute distress HEENT: Normal NECK: No JVD; No carotid bruits LYMPHATICS: No  lymphadenopathy CARDIAC: RRR, no murmurs, rubs, gallops RESPIRATORY:  Clear to auscultation without rales, wheezing or rhonchi  ABDOMEN: Soft, non-tender, non-distended MUSCULOSKELETAL:  No edema; No deformity  SKIN: Warm and dry NEUROLOGIC:  Alert and oriented x 3 PSYCHIATRIC:  Normal affect   ASSESSMENT:    1. Acute on chronic systolic heart failure ([place])   2. Coronary artery disease involving native coronary artery of native heart without angina pectoris   3. Medication management  PLAN:    In order of problems listed above:  Acute on chronic systolic heart failure secondary to nonischemic cardiomyopathy. -May be from hypertension or potentially viral. Coronary disease is nonflow limiting. -EF 42% -Starting Entresto 49/51 twice daily. Stopping her losartan 100/25 HCT -Next visit in 6 weeks, likely add Toprol. -NYHA class II-III symptoms. Had trouble walking from the parking lot to CT scan for instance, some shortness of breath. Several questions answered today. -Plan on repeat echocardiogram here in our office in the future.  Hyperlipidemia -Changing her atorvastatin 10 over to Crestor 10. Repeating lipid panel in 3 months.  Coronary artery disease -Nonflow limiting as described above on CT scan. Continue with aggressive secondary risk factor prevention.  Prior creatinine 0.9 hemoglobin A1c 6.7 hemoglobin 11.8 TSH 1.0 ALT 17 LDL 77  Shared Decision Making/Informed Consent        Medication Adjustments/Labs and Tests Ordered: Current medicines are reviewed at length with the patient today.  Concerns regarding medicines are outlined above.  Orders Placed This Encounter  Procedures  . Comprehensive metabolic panel  . Lipid panel   Meds ordered this encounter  Medications  . rosuvastatin (CRESTOR) 10 MG tablet    Sig: Take 1 tablet (10 mg total) by mouth daily.    Dispense:  30 tablet    Refill:  11  . sacubitril-valsartan (ENTRESTO) 49-51 MG    Sig: Take 1  tablet by mouth 2 (two) times daily.    Dispense:  60 tablet    Refill:  11    Patient Instructions  Medication Instructions:  Please discontinue your Atorvastatin and start Crestor 10 mg a day. Discontinue your Losartan/HCTZ. Start Entresto 49-51 mg one tablet twice a day. Continue all other medications as listed.  *If you need a refill on your cardiac medications before your next appointment, please call your pharmacy*  Lab Work: Please have blood work in 3 months (Lipid/CMP)  If you have labs (blood work) drawn today and your tests are completely normal, you will receive your results only by: [name] MyChart Message (if you have MyChart) OR . A paper copy in the mail If you have any lab test that is abnormal or we need to change your treatment, we will call you to review the results.  Follow-Up: At [facility], you and your health needs are our priority.  As part of our continuing mission to provide you with exceptional heart care, we have created designated Provider Care Teams.  These Care Teams include your primary Cardiologist (physician) and Advanced Practice Providers (APPs -  Physician Assistants and Nurse Practitioners) who all work together to provide you with the care you need, when you need it.  We recommend signing up for the patient portal called "MyChart".  Sign up information is provided on this After Visit Summary.  MyChart is used to connect with patients for Virtual Visits (Telemedicine).  Patients are able to view lab/test results, encounter notes, upcoming appointments, etc.  Non-urgent messages can be sent to your provider as well.   To learn more about what you can do with MyChart, go to [email or web address].    Your next appointment:   6 week(s)  The format for your next appointment:   In Person  Provider:   Candee Furbish, MD  Thank you for choosing [facility]!!         Signed, Candee Furbish, MD  08/17/2020 1:27 PM    [place]

## 2020-08-17 NOTE — Patient Instructions (Signed)
Medication Instructions:  Please discontinue your Atorvastatin and start Crestor 10 mg a day. Discontinue your Losartan/HCTZ. Start Entresto 49-51 mg one tablet twice a day. Continue all other medications as listed.  *If you need a refill on your cardiac medications before your next appointment, please call your pharmacy*  Lab Work: Please have blood work in 3 months (Lipid/CMP)  If you have labs (blood work) drawn today and your tests are completely normal, you will receive your results only by: [name] MyChart Message (if you have MyChart) OR . A paper copy in the mail If you have any lab test that is abnormal or we need to change your treatment, we will call you to review the results.  Follow-Up: At [facility], you and your health needs are our priority.  As part of our continuing mission to provide you with exceptional heart care, we have created designated Provider Care Teams.  These Care Teams include your primary Cardiologist (physician) and Advanced Practice Providers (APPs -  Physician Assistants and Nurse Practitioners) who all work together to provide you with the care you need, when you need it.  We recommend signing up for the patient portal called "MyChart".  Sign up information is provided on this After Visit Summary.  MyChart is used to connect with patients for Virtual Visits (Telemedicine).  Patients are able to view lab/test results, encounter notes, upcoming appointments, etc.  Non-urgent messages can be sent to your provider as well.   To learn more about what you can do with MyChart, go to [email or web address].    Your next appointment:   6 week(s)  The format for your next appointment:   In Person  Provider:   Candee Furbish, MD  Thank you for choosing [facility]!!

## 2020-08-22 DIAGNOSIS — Z23 Encounter for immunization: Secondary | ICD-10-CM | POA: Diagnosis not present

## 2020-08-28 DIAGNOSIS — I5023 Acute on chronic systolic (congestive) heart failure: Secondary | ICD-10-CM | POA: Diagnosis not present

## 2020-08-28 DIAGNOSIS — R634 Abnormal weight loss: Secondary | ICD-10-CM | POA: Diagnosis not present

## 2020-09-27 ENCOUNTER — Encounter: Payer: Self-pay | Admitting: Cardiology

## 2020-09-27 ENCOUNTER — Other Ambulatory Visit: Payer: Self-pay

## 2020-09-27 ENCOUNTER — Ambulatory Visit (INDEPENDENT_AMBULATORY_CARE_PROVIDER_SITE_OTHER): Payer: Medicare Other | Admitting: Cardiology

## 2020-09-27 VITALS — BP 120/70 | HR 88 | Ht 66.0 in | Wt 139.0 lb

## 2020-09-27 DIAGNOSIS — Z79899 Other long term (current) drug therapy: Secondary | ICD-10-CM | POA: Diagnosis not present

## 2020-09-27 DIAGNOSIS — R0600 Dyspnea, unspecified: Secondary | ICD-10-CM | POA: Diagnosis not present

## 2020-09-27 DIAGNOSIS — R0609 Other forms of dyspnea: Secondary | ICD-10-CM

## 2020-09-27 DIAGNOSIS — I5023 Acute on chronic systolic (congestive) heart failure: Secondary | ICD-10-CM | POA: Diagnosis not present

## 2020-09-27 DIAGNOSIS — I2510 Atherosclerotic heart disease of native coronary artery without angina pectoris: Secondary | ICD-10-CM

## 2020-09-27 MED ORDER — FUROSEMIDE 20 MG PO TABS: 20.0000 mg | ORAL_TABLET | Freq: Every day | ORAL | 3 refills | Status: DC

## 2020-09-27 MED ORDER — METOPROLOL SUCCINATE ER 25 MG PO TB24: 25.0000 mg | ORAL_TABLET | Freq: Every day | ORAL | 3 refills | Status: DC

## 2020-09-27 NOTE — Patient Instructions (Signed)
Medication Instructions:  Please start Furosemide 20 mg a day. Take Metoprolol 25 mg a day. Continue all other medications as listed.  *If you need a refill on your cardiac medications before your next appointment, please call your pharmacy*  Lab Work: Please have blood work today (BMP)  If you have labs (blood work) drawn today and your tests are completely normal, you will receive your results only by: [name] MyChart Message (if you have MyChart) OR . A paper copy in the mail If you have any lab test that is abnormal or we need to change your treatment, we will call you to review the results.   Testing/Procedures: Your physician has requested that you have an echocardiogram. Echocardiography is a painless test that uses sound waves to create images of your heart. It provides your doctor with information about the size and shape of your heart and how well your heart's chambers and valves are working. This procedure takes approximately one hour. There are no restrictions for this procedure.  Follow-Up: At [facility], you and your health needs are our priority.  As part of our continuing mission to provide you with exceptional heart care, we have created designated Provider Care Teams.  These Care Teams include your primary Cardiologist (physician) and Advanced Practice Providers (APPs -  Physician Assistants and Nurse Practitioners) who all work together to provide you with the care you need, when you need it.  We recommend signing up for the patient portal called "MyChart".  Sign up information is provided on this After Visit Summary.  MyChart is used to connect with patients for Virtual Visits (Telemedicine).  Patients are able to view lab/test results, encounter notes, upcoming appointments, etc.  Non-urgent messages can be sent to your provider as well.   To learn more about what you can do with MyChart, go to [email or web address].    Your next appointment:   4 week(s)  The format  for your next appointment:   In Person  Provider:   Candee Furbish, MD   Thank you for choosing [facility]!!

## 2020-09-27 NOTE — Progress Notes (Signed)
Cardiology Office Note:    Date:  09/27/2020   ID:  Catherine Leon, DOB 1941/08/20, MRN [id number]  PCP:  Deland Pretty, MD  [facility] Cardiologist:  Candee Furbish, MD  [place] Electrophysiologist:  None   Referring MD: Deland Pretty, MD    History of Present Illness:    Catherine Leon is a 80 y.o. female here for the follow-up of shortness of breath, coronary artery atherosclerosis/calcification.  Nonischemic cardiomyopathy EF 42%  We discussed her Entresto.  She questioned about cost. Overall has been feeling a little bit better, less short of breath.  Still having some ankle edema.  Asked about side effects of medications.  No chest pain, no syncope.  No dizziness.    Past Medical History:  Diagnosis Date  . Anemia   . BMI 27.0-27.9,adult   . Breast cancer ([place])   . Cancer ([place])   . Cardiac dysrhythmia   . CKD (chronic kidney disease)   . Constipation   . Contact lens/glasses fitting    wears contacts or glasses  . Diabetes mellitus   . Family history of early CAD   . Flank pain   . Foot pain   . Goiter   . History of chemotherapy   . Hot flashes   . Hx of breast cancer 05/04/2012  . Hx of radiation therapy   . Hyperlipidemia   . Hypertension   . Osteoarthritis   . Palpitations   . Personal history of chemotherapy 2001  . Personal history of radiation therapy 2001  . Post-menopausal   . Sickle cell anemia ([place])   . Urinary frequency   . Wears partial dentures    partial top  . Weight loss     Past Surgical History:  Procedure Laterality Date  . BREAST LUMPECTOMY Right 2001  . BREAST SURGERY  07/16/2000   rt lump-axillary dissectopn  . CHOLECYSTECTOMY  1995 - approximate  . COLONOSCOPY    . LESION EXCISION WITH COMPLEX REPAIR Left 10/22/2016   Procedure: COMPLEX REPAIR LEFT CHEST 10 CM;  Surgeon: Irene Limbo, MD;  Location: [place];  Service: Plastics;  Laterality: Left;  [name] MASS EXCISION Right 06/15/2013   Procedure:  EXCISION right neck MASS;  Surgeon: Adin Hector, MD;  Location: [place];  Service: General;  Laterality: Right;  . MASTECTOMY  12/11/2009   RIGHT BREAST  . TONSILLECTOMY      Current Medications: Current Meds  Medication Sig  . furosemide (LASIX) 20 MG tablet Take 1 tablet (20 mg total) by mouth daily.  . metFORMIN (GLUCOPHAGE-XR) 750 MG 24 hr tablet Take 750 mg by mouth daily.  . metoprolol succinate (TOPROL-XL) 25 MG 24 hr tablet Take 1 tablet (25 mg total) by mouth daily.  [name] oxybutynin (DITROPAN) 5 MG tablet Take 5 mg by mouth 2 (two) times daily.  . rosuvastatin (CRESTOR) 10 MG tablet Take 1 tablet (10 mg total) by mouth daily.  . sacubitril-valsartan (ENTRESTO) 49-51 MG Take 1 tablet by mouth 2 (two) times daily.     Allergies:   Patient has no known allergies.   Social History   Socioeconomic History  . Marital status: Married    Spouse name: Not on file  . Number of children: Not on file  . Years of education: Not on file  . Highest education level: Not on file  Occupational History  . Not on file  Tobacco Use  . Smoking status: Former Smoker  Quit date: 11/04/1982    Years since quitting: 37.9  . Smokeless tobacco: Never Used  Substance and Sexual Activity  . Alcohol use: Yes    Comment: WINE. AVERAGE 1 GLASS A MONTH  . Drug use: No  . Sexual activity: Not on file  Other Topics Concern  . Not on file  Social History Narrative  . Not on file   Social Determinants of Health   Financial Resource Strain: Not on file  Food Insecurity: Not on file  Transportation Needs: Not on file  Physical Activity: Not on file  Stress: Not on file  Social Connections: Not on file     Family History: The patient's family history includes Breast cancer in her paternal aunt; Cancer in her paternal aunt; Diabetes in her father; Heart disease in her mother; Kidney disease in her father.  ROS:   Please see the history of present illness.     All other  systems reviewed and are negative.  EKGs/Labs/Other Studies Reviewed:    The following studies were reviewed today:  Outside echocardiogram report reviewed. Ejection fraction moderately reduced at 42%. Unfortunately I am not able to visualize images since it was performed at outside source.  A CT scan of her coronary arteries was performed on 08/11/2020: IMPRESSION: 1. Coronary calcium score of 26. This was 14 percentile for age and sex matched control.  2. Normal coronary origin with left dominance.  3. CAD-RADS 2. Mild non-obstructive CAD (25-49%). Consider non-atherosclerotic causes of chest pain. Consider preventive therapy and risk factor modification.   Recent Labs: 08/08/2020: BUN 7; Creatinine, Ser 0.93; Potassium 3.9; Sodium 141  Recent Lipid Panel No results found for: CHOL, TRIG, HDL, CHOLHDL, VLDL, LDLCALC, LDLDIRECT   Risk Assessment/Calculations:       Physical Exam:    VS:  BP 120/70 (BP Location: Left Arm, Patient Position: Sitting, Cuff Size: Normal)   Pulse 88   Ht 5\' 6"  (1.676 m)   Wt 139 lb (63 kg)   BMI 22.44 kg/m     Wt Readings from Last 3 Encounters:  09/27/20 139 lb (63 kg)  08/17/20 140 lb (63.5 kg)  07/27/20 141 lb (64 kg)     GEN:  Well nourished, well developed in no acute distress HEENT: Normal NECK: No JVD; No carotid bruits LYMPHATICS: No lymphadenopathy CARDIAC: RRR, no murmurs, rubs, gallops RESPIRATORY:  Clear to auscultation without rales, wheezing or rhonchi  ABDOMEN: Soft, non-tender, non-distended MUSCULOSKELETAL: 2+ lower extremity ankle edema; No deformity  SKIN: Warm and dry NEUROLOGIC:  Alert and oriented x 3 PSYCHIATRIC:  Normal affect   ASSESSMENT:    1. Acute on chronic systolic heart failure ([place])   2. Medication management   3. Dyspnea on exertion   4. Coronary artery disease involving native coronary artery of native heart without angina pectoris    PLAN:    In order of problems listed  above:   Acute on chronic systolic heart failure secondary to nonischemic cardiomyopathy. -May be from hypertension or potentially viral. Coronary disease is nonflow limiting.  This was determined by CT scan as above. -EF 42% -Entresto 49/51 twice daily. Stopped her losartan 100/25 HCT -Adding Toprol 25 mg once a day. -Adding Lasix 20 mg once a day- ankle edema 1-2+ bilaterally -NYHA class II-III symptoms. Had trouble walking from the parking lot to CT scan for instance, some shortness of breath.  Mild improvement. -We check echocardiogram.  Hyperlipidemia -Changing her atorvastatin 10 over to Crestor 10. Repeating lipid panel  in 2 months.  Coronary artery disease -Nonflow limiting as described above on CT scan. Continue with aggressive secondary risk factor prevention.   Medication Adjustments/Labs and Tests Ordered: Current medicines are reviewed at length with the patient today.  Concerns regarding medicines are outlined above.  Orders Placed This Encounter  Procedures  . Basic metabolic panel  . ECHOCARDIOGRAM COMPLETE   Meds ordered this encounter  Medications  . metoprolol succinate (TOPROL-XL) 25 MG 24 hr tablet    Sig: Take 1 tablet (25 mg total) by mouth daily.    Dispense:  90 tablet    Refill:  3  . furosemide (LASIX) 20 MG tablet    Sig: Take 1 tablet (20 mg total) by mouth daily.    Dispense:  90 tablet    Refill:  3    Patient Instructions  Medication Instructions:  Please start Furosemide 20 mg a day. Take Metoprolol 25 mg a day. Continue all other medications as listed.  *If you need a refill on your cardiac medications before your next appointment, please call your pharmacy*  Lab Work: Please have blood work today (BMP)  If you have labs (blood work) drawn today and your tests are completely normal, you will receive your results only by: [name] MyChart Message (if you have MyChart) OR . A paper copy in the mail If you have any lab test that is  abnormal or we need to change your treatment, we will call you to review the results.   Testing/Procedures: Your physician has requested that you have an echocardiogram. Echocardiography is a painless test that uses sound waves to create images of your heart. It provides your doctor with information about the size and shape of your heart and how well your heart's chambers and valves are working. This procedure takes approximately one hour. There are no restrictions for this procedure.  Follow-Up: At [facility], you and your health needs are our priority.  As part of our continuing mission to provide you with exceptional heart care, we have created designated Provider Care Teams.  These Care Teams include your primary Cardiologist (physician) and Advanced Practice Providers (APPs -  Physician Assistants and Nurse Practitioners) who all work together to provide you with the care you need, when you need it.  We recommend signing up for the patient portal called "MyChart".  Sign up information is provided on this After Visit Summary.  MyChart is used to connect with patients for Virtual Visits (Telemedicine).  Patients are able to view lab/test results, encounter notes, upcoming appointments, etc.  Non-urgent messages can be sent to your provider as well.   To learn more about what you can do with MyChart, go to [email or web address].    Your next appointment:   4 week(s)  The format for your next appointment:   In Person  Provider:   Candee Furbish, MD   Thank you for choosing [facility]!!         Signed, Candee Furbish, MD  09/27/2020 5:14 PM    [place]

## 2020-09-28 LAB — BASIC METABOLIC PANEL
BUN/Creatinine Ratio: 7 — ABNORMAL LOW (ref 12–28)
BUN: 7 mg/dL — ABNORMAL LOW (ref 8–27)
CO2: 27 mmol/L (ref 20–29)
Calcium: 9.3 mg/dL (ref 8.7–10.3)
Chloride: 107 mmol/L — ABNORMAL HIGH (ref 96–106)
Creatinine, Ser: 0.97 mg/dL (ref 0.57–1.00)
GFR calc Af Amer: 64 mL/min/{1.73_m2} (ref >59)
GFR calc non Af Amer: 55 mL/min/{1.73_m2} — ABNORMAL LOW (ref >59)
Glucose: 123 mg/dL — ABNORMAL HIGH (ref 65–99)
Potassium: 3.6 mmol/L (ref 3.5–5.2)
Sodium: 148 mmol/L — ABNORMAL HIGH (ref 134–144)

## 2020-10-17 ENCOUNTER — Other Ambulatory Visit: Payer: Self-pay

## 2020-10-17 ENCOUNTER — Ambulatory Visit (HOSPITAL_COMMUNITY): Payer: Medicare Other | Attending: Internal Medicine

## 2020-10-17 DIAGNOSIS — R0600 Dyspnea, unspecified: Secondary | ICD-10-CM | POA: Insufficient documentation

## 2020-10-17 DIAGNOSIS — I5023 Acute on chronic systolic (congestive) heart failure: Secondary | ICD-10-CM | POA: Insufficient documentation

## 2020-10-17 DIAGNOSIS — R0609 Other forms of dyspnea: Secondary | ICD-10-CM

## 2020-10-17 LAB — ECHOCARDIOGRAM COMPLETE
Area-P 1/2: 4.31 cm2
P 1/2 time: 435 msec
S' Lateral: 3.7 cm

## 2020-10-19 DIAGNOSIS — H25011 Cortical age-related cataract, right eye: Secondary | ICD-10-CM | POA: Diagnosis not present

## 2020-10-19 DIAGNOSIS — E119 Type 2 diabetes mellitus without complications: Secondary | ICD-10-CM | POA: Diagnosis not present

## 2020-10-20 ENCOUNTER — Telehealth: Payer: Self-pay

## 2020-10-20 NOTE — Telephone Encounter (Signed)
The patient has been notified of the result and verbalized understanding.  All questions (if any) were answered. Wilma Flavin, RN 10/20/2020 4:08 PM

## 2020-10-20 NOTE — Telephone Encounter (Signed)
-----   Message from Jerline Pain, MD sent at 10/20/2020  3:57 PM EST ----- Ejection fraction 45 to 50%, mildly improved from 42%. No significant valvular lesions  Mildly improved echocardiogram from prior.  Candee Furbish, MD

## 2020-10-23 ENCOUNTER — Other Ambulatory Visit: Payer: Self-pay | Admitting: Cardiology

## 2020-10-31 ENCOUNTER — Encounter: Payer: Self-pay | Admitting: Cardiology

## 2020-10-31 ENCOUNTER — Ambulatory Visit (INDEPENDENT_AMBULATORY_CARE_PROVIDER_SITE_OTHER): Payer: Medicare Other | Admitting: Cardiology

## 2020-10-31 ENCOUNTER — Other Ambulatory Visit: Payer: Self-pay

## 2020-10-31 ENCOUNTER — Telehealth: Payer: Self-pay | Admitting: *Deleted

## 2020-10-31 VITALS — BP 130/70 | HR 68 | Ht 66.0 in | Wt 138.0 lb

## 2020-10-31 DIAGNOSIS — I428 Other cardiomyopathies: Secondary | ICD-10-CM | POA: Diagnosis not present

## 2020-10-31 DIAGNOSIS — I5022 Chronic systolic (congestive) heart failure: Secondary | ICD-10-CM

## 2020-10-31 DIAGNOSIS — I2510 Atherosclerotic heart disease of native coronary artery without angina pectoris: Secondary | ICD-10-CM | POA: Diagnosis not present

## 2020-10-31 DIAGNOSIS — Z79899 Other long term (current) drug therapy: Secondary | ICD-10-CM | POA: Diagnosis not present

## 2020-10-31 MED ORDER — SPIRONOLACTONE 25 MG PO TABS: 12.5000 mg | ORAL_TABLET | Freq: Every day | ORAL | 3 refills | Status: DC

## 2020-10-31 MED ORDER — SPIRONOLACTONE 25 MG PO TABS
12.5000 mg | ORAL_TABLET | Freq: Every day | ORAL | 3 refills | Status: DC
Start: 2020-10-31 — End: 2021-12-12

## 2020-10-31 NOTE — Patient Instructions (Signed)
Medication Instructions:  Please start Spironolactone 12.5 mg a day. Continue all other medications as listetd.  *If you need a refill on your cardiac medications before your next appointment, please call your pharmacy*  Lab Work: Please have blood work in 2 weeks (BMP)  If you have labs (blood work) drawn today and your tests are completely normal, you will receive your results only by: [name] MyChart Message (if you have MyChart) OR . A paper copy in the mail If you have any lab test that is abnormal or we need to change your treatment, we will call you to review the results.  Follow-Up: At [facility], you and your health needs are our priority.  As part of our continuing mission to provide you with exceptional heart care, we have created designated Provider Care Teams.  These Care Teams include your primary Cardiologist (physician) and Advanced Practice Providers (APPs -  Physician Assistants and Nurse Practitioners) who all work together to provide you with the care you need, when you need it.  We recommend signing up for the patient portal called "MyChart".  Sign up information is provided on this After Visit Summary.  MyChart is used to connect with patients for Virtual Visits (Telemedicine).  Patients are able to view lab/test results, encounter notes, upcoming appointments, etc.  Non-urgent messages can be sent to your provider as well.   To learn more about what you can do with MyChart, go to [email or web address].    Your next appointment:   6 month(s)  The format for your next appointment:   In Person  Provider:   Candee Furbish, MD   Thank you for choosing [facility]!!

## 2020-10-31 NOTE — Progress Notes (Signed)
Cardiology Office Note:    Date:  10/31/2020   ID:  Catherine Leon, DOB [birth year], MRN [id number]  PCP:  Catherine Leon, Catherine Leon  Cardiologist:  Catherine Furbish, MD  Advanced Practice Provider:  No care team member to display Electrophysiologist:  None       Referring MD: Catherine Pretty, MD     History of Present Illness:    Catherine Leon is a 80 y.o. female here for the follow-up of nonischemic cardiomyopathy.  After getting her Entresto, her ejection fraction has improved from 42 up to 45 to 50%.  Still having some ankle edema.  Occasional shortness of breath with activity.  NYHA class I-II symptoms.  Other family members have had cardiomyopathy as well, likely familial.  No fevers chills nausea vomiting syncope bleeding.  Potassium 3.6 creatinine 0.97  Past Medical History:  Diagnosis Date  . Anemia   . BMI 27.0-27.9,adult   . Breast cancer ([place])   . Cancer ([place])   . Cardiac dysrhythmia   . CKD (chronic kidney disease)   . Constipation   . Contact lens/glasses fitting    wears contacts or glasses  . Diabetes mellitus   . Family history of early CAD   . Flank pain   . Foot pain   . Goiter   . History of chemotherapy   . Hot flashes   . Hx of breast cancer 05/04/2012  . Hx of radiation therapy   . Hyperlipidemia   . Hypertension   . Osteoarthritis   . Palpitations   . Personal history of chemotherapy 2001  . Personal history of radiation therapy 2001  . Post-menopausal   . Sickle cell anemia ([place])   . Urinary frequency   . Wears partial dentures    partial top  . Weight loss     Past Surgical History:  Procedure Laterality Date  . BREAST LUMPECTOMY Right 2001  . BREAST SURGERY  07/16/2000   rt lump-axillary dissectopn  . CHOLECYSTECTOMY  1995 - approximate  . COLONOSCOPY    . LESION EXCISION WITH COMPLEX REPAIR Left 10/22/2016   Procedure: COMPLEX REPAIR LEFT CHEST 10 CM;  Surgeon: Catherine Limbo, MD;  Location: [place];  Service: Plastics;  Laterality: Left;  [name] MASS EXCISION Right 06/15/2013   Procedure: EXCISION right neck MASS;  Surgeon: Catherine Hector, MD;  Location: [place];  Service: General;  Laterality: Right;  . MASTECTOMY  12/11/2009   RIGHT BREAST  . TONSILLECTOMY      Current Medications: Current Meds  Medication Sig  . furosemide (LASIX) 20 MG tablet Take 1 tablet (20 mg total) by mouth daily.  . metFORMIN (GLUCOPHAGE-XR) 750 MG 24 hr tablet Take 750 mg by mouth daily.  . metoprolol succinate (TOPROL-XL) 25 MG 24 hr tablet Take 1 tablet (25 mg total) by mouth daily.  [name] oxybutynin (DITROPAN) 5 MG tablet Take 5 mg by mouth 2 (two) times daily.  . rosuvastatin (CRESTOR) 10 MG tablet TAKE 1 TABLET BY MOUTH EVERY DAY  . sacubitril-valsartan (ENTRESTO) 49-51 MG Take 1 tablet by mouth 2 (two) times daily.  . [DISCONTINUED] spironolactone (ALDACTONE) 25 MG tablet Take 0.5 tablets (12.5 mg total) by mouth daily.     Allergies:   Patient has no known allergies.   Social History   Socioeconomic History  . Marital status: Married    Spouse name: Not on file  . Number of children: Not on  file  . Years of education: Not on file  . Highest education level: Not on file  Occupational History  . Not on file  Tobacco Use  . Smoking status: Former Smoker    Quit date: 11/04/1982    Years since quitting: 38.0  . Smokeless tobacco: Never Used  Substance and Sexual Activity  . Alcohol use: Yes    Comment: WINE. AVERAGE 1 GLASS A MONTH  . Drug use: No  . Sexual activity: Not on file  Other Topics Concern  . Not on file  Social History Narrative  . Not on file   Social Determinants of Health   Financial Resource Strain: Not on file  Food Insecurity: Not on file  Transportation Needs: Not on file  Physical Activity: Not on file  Stress: Not on file  Social Connections: Not on file     Family History: The patient's family history includes Breast cancer  in her paternal aunt; Cancer in her paternal aunt; Diabetes in her father; Heart disease in her mother; Kidney disease in her father.  ROS:   Please see the history of present illness.     All other systems reviewed and are negative.  EKGs/Labs/Other Studies Reviewed:    The following studies were reviewed today:   A CT scan of her coronary arteries was performed on 08/11/2020: IMPRESSION: 1. Coronary calcium score of 26. This was 46 percentile for age and sex matched control.  2. Normal coronary origin with left dominance.  3. CAD-RADS 2. Mild non-obstructive CAD (25-49%). Consider non-atherosclerotic causes of chest pain. Consider preventive therapy and risk factor modification.  ECHO 10/17/20:  1. Mitral b-bump in m-mode, suggestive of elevated LVEDP. LV Apical false  tendon. Left ventricular ejection fraction, by estimation, is 45 to 50%.  The left ventricle has mildly decreased function. The left ventricle  demonstrates global hypokinesis. Left  ventricular diastolic parameters are consistent with Grade II diastolic  dysfunction (pseudonormalization). Elevated left ventricular end-diastolic  pressure. The E/e' is 25. The average left ventricular global longitudinal  strain is -11.3 %. The global  longitudinal strain is abnormal.  2. Right ventricular systolic function is mildly reduced. The right  ventricular size is normal.  3. Left atrial size was moderately dilated.  4. Right atrial size was mild to moderately dilated.  5. The mitral valve is abnormal. Mild mitral valve regurgitation.  6. The aortic valve is tricuspid. Aortic valve regurgitation is trivial.  Mild aortic valve sclerosis is present, with no evidence of aortic valve  stenosis.   Comparison(s): Changes from prior study are noted. 07/27/2020: LVEF 42%.    EKG: Sinus rhythm left axis deviation/left anterior fascicular block 82  Recent Labs: 09/27/2020: BUN 7; Creatinine, Ser 0.97; Potassium  3.6; Sodium 148  Recent Lipid Panel No results found for: CHOL, TRIG, HDL, CHOLHDL, VLDL, LDLCALC, LDLDIRECT   Risk Assessment/Calculations:      Physical Exam:    VS:  BP 130/70 (BP Location: Left Arm, Patient Position: Sitting, Cuff Size: Normal)   Pulse 68   Ht 5\' 6"  (1.676 m)   Wt 138 lb (62.6 kg)   BMI 22.27 kg/m     Wt Readings from Last 3 Encounters:  10/31/20 138 lb (62.6 kg)  09/27/20 139 lb (63 kg)  08/17/20 140 lb (63.5 kg)     GEN:  Well nourished, well developed in no acute distress HEENT: Normal NECK: No JVD; No carotid bruits LYMPHATICS: No lymphadenopathy CARDIAC: RRR, no murmurs, rubs, gallops RESPIRATORY:  Clear to auscultation without rales, wheezing or rhonchi  ABDOMEN: Soft, non-tender, non-distended MUSCULOSKELETAL:  Mild ankle edema; No deformity  SKIN: Warm and dry NEUROLOGIC:  Alert and oriented x 3 PSYCHIATRIC:  Normal affect   ASSESSMENT:    1. Chronic systolic heart failure ([place])   2. Coronary artery disease involving native coronary artery of native heart without angina pectoris   3. Nonischemic cardiomyopathy ([place])   4. Medication management    PLAN:    In order of problems listed above:  Nonischemic cardiomyopathy, likely familial cardiomyopathy, chronic systolic heart failure -Minimal nonflow limiting CAD on coronary CT scan, EF approximately 45-50% now improved from 42% -On Entresto mid dose 49/51, on Toprol 25 -On Lasix 20 mg a day, ankle edema  -We will add spironolactone 12.5 mg once a day -Check basic metabolic profile in 2 weeks.  Medication assistance -Having trouble affording Entresto.  She has paperwork from company.  We will get her in touch with patient assistance.  6 month follow up    Medication Adjustments/Labs and Tests Ordered: Current medicines are reviewed at length with the patient today.  Concerns regarding medicines are outlined above.  Orders Placed This Encounter  Procedures  . Basic Metabolic Panel  (BMET)   Meds ordered this encounter  Medications  . DISCONTD: spironolactone (ALDACTONE) 25 MG tablet    Sig: Take 0.5 tablets (12.5 mg total) by mouth daily.    Dispense:  45 tablet    Refill:  3  . spironolactone (ALDACTONE) 25 MG tablet    Sig: Take 0.5 tablets (12.5 mg total) by mouth daily.    Dispense:  45 tablet    Refill:  3    Patient Instructions  Medication Instructions:  Please start Spironolactone 12.5 mg a day. Continue all other medications as listetd.  *If you need a refill on your cardiac medications before your next appointment, please call your pharmacy*  Lab Work: Please have blood work in 2 weeks (BMP)  If you have labs (blood work) drawn today and your tests are completely normal, you will receive your results only by: [name] MyChart Message (if you have MyChart) OR . A paper copy in the mail If you have any lab test that is abnormal or we need to change your treatment, we will call you to review the results.  Follow-Up: At [facility], you and your health needs are our priority.  As part of our continuing mission to provide you with exceptional heart care, we have created designated Provider Care Teams.  These Care Teams include your primary Cardiologist (physician) and Advanced Practice Providers (APPs -  Physician Assistants and Nurse Practitioners) who all work together to provide you with the care you need, when you need it.  We recommend signing up for the patient portal called "MyChart".  Sign up information is provided on this After Visit Summary.  MyChart is used to connect with patients for Virtual Visits (Telemedicine).  Patients are able to view lab/test results, encounter notes, upcoming appointments, etc.  Non-urgent messages can be sent to your provider as well.   To learn more about what you can do with MyChart, go to [email or web address].    Your next appointment:   6 month(s)  The format for your next appointment:   In  Person  Provider:   Candee Furbish, MD   Thank you for choosing [facility]!!        Signed, Catherine Furbish, MD  10/31/2020 3:44 PM  [facility]

## 2020-10-31 NOTE — Telephone Encounter (Signed)
Received completed pw from pt for assistance for Entresto from [employer].  Dr Marlou Porch completed and signed his portion.  Faxed all requested pw to foundation.

## 2020-11-15 ENCOUNTER — Other Ambulatory Visit: Payer: Medicare Other

## 2020-11-16 ENCOUNTER — Other Ambulatory Visit: Payer: Self-pay

## 2020-11-16 ENCOUNTER — Other Ambulatory Visit: Payer: Medicare Other | Admitting: *Deleted

## 2020-11-16 DIAGNOSIS — I5023 Acute on chronic systolic (congestive) heart failure: Secondary | ICD-10-CM | POA: Diagnosis not present

## 2020-11-16 DIAGNOSIS — I2510 Atherosclerotic heart disease of native coronary artery without angina pectoris: Secondary | ICD-10-CM | POA: Diagnosis not present

## 2020-11-16 DIAGNOSIS — Z79899 Other long term (current) drug therapy: Secondary | ICD-10-CM | POA: Diagnosis not present

## 2020-11-16 LAB — COMPREHENSIVE METABOLIC PANEL
ALT: 33 IU/L — ABNORMAL HIGH (ref 0–32)
AST: 27 IU/L (ref 0–40)
Albumin/Globulin Ratio: 1.8 (ref 1.2–2.2)
Albumin: 3.9 g/dL (ref 3.7–4.7)
Alkaline Phosphatase: 92 IU/L (ref 44–121)
BUN/Creatinine Ratio: 10 — ABNORMAL LOW (ref 12–28)
BUN: 12 mg/dL (ref 8–27)
Bilirubin Total: 0.7 mg/dL (ref 0.0–1.2)
CO2: 23 mmol/L (ref 20–29)
Calcium: 9.4 mg/dL (ref 8.7–10.3)
Chloride: 105 mmol/L (ref 96–106)
Creatinine, Ser: 1.17 mg/dL — ABNORMAL HIGH (ref 0.57–1.00)
Globulin, Total: 2.2 g/dL (ref 1.5–4.5)
Glucose: 136 mg/dL — ABNORMAL HIGH (ref 65–99)
Potassium: 3.7 mmol/L (ref 3.5–5.2)
Sodium: 144 mmol/L (ref 134–144)
Total Protein: 6.1 g/dL (ref 6.0–8.5)
eGFR: 47 mL/min/{1.73_m2} — ABNORMAL LOW (ref >59)

## 2020-11-16 LAB — LIPID PANEL
Chol/HDL Ratio: 2.1 ratio (ref 0.0–4.4)
Cholesterol, Total: 116 mg/dL (ref 100–199)
HDL: 54 mg/dL (ref >39)
LDL Chol Calc (NIH): 49 mg/dL (ref 0–99)
Triglycerides: 56 mg/dL (ref 0–149)
VLDL Cholesterol Cal: 13 mg/dL (ref 5–40)

## 2020-12-19 ENCOUNTER — Other Ambulatory Visit: Payer: Medicare Other

## 2020-12-19 ENCOUNTER — Other Ambulatory Visit: Payer: Self-pay

## 2020-12-19 ENCOUNTER — Telehealth: Payer: Self-pay | Admitting: Cardiology

## 2020-12-19 DIAGNOSIS — I5022 Chronic systolic (congestive) heart failure: Secondary | ICD-10-CM

## 2020-12-19 DIAGNOSIS — I428 Other cardiomyopathies: Secondary | ICD-10-CM

## 2020-12-19 DIAGNOSIS — Z79899 Other long term (current) drug therapy: Secondary | ICD-10-CM | POA: Diagnosis not present

## 2020-12-19 NOTE — Telephone Encounter (Signed)
Pt c/o swelling: STAT is pt has developed SOB within 24 hours  1) How much weight have you gained and in what time span? N/A  2) If swelling, where is the swelling located? Both legs and feet  3) Are you currently taking a fluid pill? Yes   4) Are you currently SOB? No   5) Do you have a log of your daily weights (if so, list)? No  6) Have you gained 3 pounds in a day or 5 pounds in a week? Yes  7) Have you traveled recently? No  Not urinating a lot and drinking a lot of water. Wears compression socks but still experience severe swelling. Thinks medication is causing swelling.

## 2020-12-19 NOTE — Telephone Encounter (Signed)
Have her come in for BMET - systolic heart failure  After that we can adjust her lasix for more diuresis if able.   Thanks  Candee Furbish, MD

## 2020-12-19 NOTE — Telephone Encounter (Signed)
Patient informed of recommendation to acquire a bmet when possible and patient will come today.  Order placed and appointment made.

## 2020-12-19 NOTE — Telephone Encounter (Signed)
Spoke to patient about worsening edema in bilateral lower extremities and patient stated the edema is up to her thighs and "hard to the touch".  Patient reports that she isn't voiding as often since starting new medication, spironolactone after last office visit 2/15.  Patient reports new shortness of breath on exertion. Patient reports wearing her compression stockings daily and elevates her legs when sitting down.    Patient does not add salt to meals and reports not eating any fast foods.  Patient has scale and educated on dry weights and logging this information for reference on weight gain.  Patient is concerned about her kidney function.  Patient advised this information will be sent to Dr. Marlou Porch for advisement and a return call will be completed with direction.

## 2020-12-20 ENCOUNTER — Telehealth: Payer: Self-pay | Admitting: Cardiology

## 2020-12-20 LAB — BASIC METABOLIC PANEL
BUN/Creatinine Ratio: 8 — ABNORMAL LOW (ref 12–28)
BUN: 8 mg/dL (ref 8–27)
CO2: 24 mmol/L (ref 20–29)
Calcium: 9.9 mg/dL (ref 8.7–10.3)
Chloride: 104 mmol/L (ref 96–106)
Creatinine, Ser: 0.95 mg/dL (ref 0.57–1.00)
Glucose: 102 mg/dL — ABNORMAL HIGH (ref 65–99)
Potassium: 3.4 mmol/L — ABNORMAL LOW (ref 3.5–5.2)
Sodium: 145 mmol/L — ABNORMAL HIGH (ref 134–144)
eGFR: 61 mL/min/{1.73_m2} (ref >59)

## 2020-12-20 NOTE — Telephone Encounter (Signed)
Patient has sent a message to scheduling requesting an appt with Dr. Kingsley Plan this week for swelling. Per phone note from yesterday, Dr. Kingsley Plan recommended blood work. She has had the blood work done and Dr. Kingsley Plan recommends following up with an APP next week. There is nothing available at [facility] or [facility] until May. Not sure if pt needs to see DOD, please advise.

## 2020-12-21 MED ORDER — POTASSIUM CHLORIDE CRYS ER 20 MEQ PO TBCR: 20.0000 meq | EXTENDED_RELEASE_TABLET | Freq: Every day | ORAL | 1 refills | Status: DC

## 2020-12-21 MED ORDER — POTASSIUM CHLORIDE CRYS ER 20 MEQ PO TBCR
20.0000 meq | EXTENDED_RELEASE_TABLET | Freq: Every day | ORAL | 3 refills | Status: DC
Start: 2020-12-21 — End: 2021-02-07

## 2020-12-21 NOTE — Telephone Encounter (Signed)
Kidney function is stable, creatinine 0.95  Potassium mildly reduced   With worsening edema, dyspnea let's increase lasix from 20mg  PO QD to 40mg  PO twice a day for the next 4 days with K-Dur 30meq PO once a day. After that, lasix 40mg  PO once a day.   Have her come in next week for appt with APP. Thanks   Candee Furbish, MD    Pt aware of the information above and rx sent into pharmacy of choice.  Appt has been scheduled with Dr Marlou Porch in f/u.  Pt will continue to monitor her s/s, daily wt etc and call with any questions or concerns.  At this time she reports feeling better with increase in Furosemide and having much less SOB.

## 2021-01-14 ENCOUNTER — Other Ambulatory Visit: Payer: Self-pay | Admitting: Cardiology

## 2021-01-15 ENCOUNTER — Ambulatory Visit (INDEPENDENT_AMBULATORY_CARE_PROVIDER_SITE_OTHER): Payer: Medicare Other | Admitting: Cardiology

## 2021-01-15 ENCOUNTER — Other Ambulatory Visit: Payer: Self-pay

## 2021-01-15 ENCOUNTER — Encounter: Payer: Self-pay | Admitting: Cardiology

## 2021-01-15 VITALS — BP 130/70 | HR 76 | Ht 66.0 in | Wt 132.0 lb

## 2021-01-15 DIAGNOSIS — I2510 Atherosclerotic heart disease of native coronary artery without angina pectoris: Secondary | ICD-10-CM | POA: Diagnosis not present

## 2021-01-15 DIAGNOSIS — Z79899 Other long term (current) drug therapy: Secondary | ICD-10-CM | POA: Diagnosis not present

## 2021-01-15 DIAGNOSIS — I5022 Chronic systolic (congestive) heart failure: Secondary | ICD-10-CM

## 2021-01-15 LAB — BASIC METABOLIC PANEL
BUN/Creatinine Ratio: 13 (ref 12–28)
BUN: 13 mg/dL (ref 8–27)
CO2: 29 mmol/L (ref 20–29)
Calcium: 9.8 mg/dL (ref 8.7–10.3)
Chloride: 101 mmol/L (ref 96–106)
Creatinine, Ser: 1.03 mg/dL — ABNORMAL HIGH (ref 0.57–1.00)
Glucose: 108 mg/dL — ABNORMAL HIGH (ref 65–99)
Potassium: 4.1 mmol/L (ref 3.5–5.2)
Sodium: 143 mmol/L (ref 134–144)
eGFR: 55 mL/min/{1.73_m2} — ABNORMAL LOW (ref >59)

## 2021-01-15 MED ORDER — ROSUVASTATIN CALCIUM 10 MG PO TABS
10.0000 mg | ORAL_TABLET | Freq: Every day | ORAL | 3 refills | Status: DC
Start: 2021-01-15 — End: 2022-02-15

## 2021-01-15 MED ORDER — FUROSEMIDE 40 MG PO TABS
40.0000 mg | ORAL_TABLET | Freq: Every day | ORAL | 3 refills | Status: DC
Start: 2021-01-15 — End: 2021-03-21

## 2021-01-15 NOTE — Progress Notes (Signed)
Cardiology Office Note:    Date:  01/15/2021   ID:  Catherine Leon, DOB 1941-03-06, MRN [id number]  PCP:  Deland Pretty, [facility]  Cardiologist:  Candee Furbish, MD  Advanced Practice Provider:  No care team member to display Electrophysiologist:  None       Referring MD: Deland Pretty, MD     History of Present Illness:    Catherine Leon is a 80 y.o. female here for the evaluation of worsening edema shortness of breath.  Increase Lasix from 20 mg once a day to 40 mg twice a day for 4 days back in April 7.  She felt better with the increase in furosemide having much less shortness of breath. Gained 10 pounds.  Did well with the increase in Lasix.  She also takes metformin which is a large pill she states.  Potassium is hard to swallow as well.  Her creatinine on 12/19/2020 was 0.95 with potassium of 3.4.  Recent LDL 49  She has nonischemic cardiomyopathy, EF has improved from 42 to 50%.  Has occasional shortness of breath with activity NYHA class II-like symptoms.  Denies any fevers chills nausea vomiting syncope bleeding.  Past Medical History:  Diagnosis Date  . Anemia   . BMI 27.0-27.9,adult   . Breast cancer ([place])   . Cancer ([place])   . Cardiac dysrhythmia   . CKD (chronic kidney disease)   . Constipation   . Contact lens/glasses fitting    wears contacts or glasses  . Diabetes mellitus   . Family history of early CAD   . Flank pain   . Foot pain   . Goiter   . History of chemotherapy   . Hot flashes   . Hx of breast cancer 05/04/2012  . Hx of radiation therapy   . Hyperlipidemia   . Hypertension   . Osteoarthritis   . Palpitations   . Personal history of chemotherapy 2001  . Personal history of radiation therapy 2001  . Post-menopausal   . Sickle cell anemia ([place])   . Urinary frequency   . Wears partial dentures    partial top  . Weight loss     Past Surgical History:  Procedure Laterality Date  . BREAST LUMPECTOMY Right 2001  .  BREAST SURGERY  07/16/2000   rt lump-axillary dissectopn  . CHOLECYSTECTOMY  1995 - approximate  . COLONOSCOPY    . LESION EXCISION WITH COMPLEX REPAIR Left 10/22/2016   Procedure: COMPLEX REPAIR LEFT CHEST 10 CM;  Surgeon: Irene Limbo, MD;  Location: [place];  Service: Plastics;  Laterality: Left;  [name] MASS EXCISION Right 06/15/2013   Procedure: EXCISION right neck MASS;  Surgeon: Adin Hector, MD;  Location: [place];  Service: General;  Laterality: Right;  . MASTECTOMY  12/11/2009   RIGHT BREAST  . TONSILLECTOMY      Current Medications: Current Meds  Medication Sig  . metFORMIN (GLUCOPHAGE-XR) 750 MG 24 hr tablet Take 750 mg by mouth daily.  . metoprolol succinate (TOPROL-XL) 25 MG 24 hr tablet Take 1 tablet (25 mg total) by mouth daily.  [name] oxybutynin (DITROPAN) 5 MG tablet Take 5 mg by mouth 2 (two) times daily.  . potassium chloride SA (KLOR-CON) 20 MEQ tablet Take 1 tablet (20 mEq total) by mouth daily.  . sacubitril-valsartan (ENTRESTO) 49-51 MG Take 1 tablet by mouth 2 (two) times daily.  [name] spironolactone (ALDACTONE) 25 MG tablet Take 0.5  tablets (12.5 mg total) by mouth daily.  . [DISCONTINUED] furosemide (LASIX) 40 MG tablet Take 40 mg by mouth daily.  . [DISCONTINUED] rosuvastatin (CRESTOR) 10 MG tablet TAKE 1 TABLET BY MOUTH EVERY DAY     Allergies:   Patient has no known allergies.   Social History   Socioeconomic History  . Marital status: Married    Spouse name: Not on file  . Number of children: Not on file  . Years of education: Not on file  . Highest education level: Not on file  Occupational History  . Not on file  Tobacco Use  . Smoking status: Former Smoker    Quit date: 11/04/1982    Years since quitting: 38.2  . Smokeless tobacco: Never Used  Substance and Sexual Activity  . Alcohol use: Yes    Comment: WINE. AVERAGE 1 GLASS A MONTH  . Drug use: No  . Sexual activity: Not on file  Other Topics Concern  . Not  on file  Social History Narrative  . Not on file   Social Determinants of Health   Financial Resource Strain: Not on file  Food Insecurity: Not on file  Transportation Needs: Not on file  Physical Activity: Not on file  Stress: Not on file  Social Connections: Not on file     Family History: The patient's family history includes Breast cancer in her paternal aunt; Cancer in her paternal aunt; Diabetes in her father; Heart disease in her mother; Kidney disease in her father.  ROS:   Please see the history of present illness.     All other systems reviewed and are negative.  EKGs/Labs/Other Studies Reviewed:    The following studies were reviewed today:   A CT scan of her coronary arteries was performed on 08/11/2020: IMPRESSION: 1. Coronary calcium score of 26. This was 33 percentile for age and sex matched control.  2. Normal coronary origin with left dominance.  3. CAD-RADS 2. Mild non-obstructive CAD (25-49%). Consider non-atherosclerotic causes of chest pain. Consider preventive therapy and risk factor modification.  ECHO 10/17/20:  1. Mitral b-bump in m-mode, suggestive of elevated LVEDP. LV Apical false  tendon. Left ventricular ejection fraction, by estimation, is 45 to 50%.  The left ventricle has mildly decreased function. The left ventricle  demonstrates global hypokinesis. Left  ventricular diastolic parameters are consistent with Grade II diastolic  dysfunction (pseudonormalization). Elevated left ventricular end-diastolic  pressure. The E/e' is 25. The average left ventricular global longitudinal  strain is -11.3 %. The global  longitudinal strain is abnormal.  2. Right ventricular systolic function is mildly reduced. The right  ventricular size is normal.  3. Left atrial size was moderately dilated.  4. Right atrial size was mild to moderately dilated.  5. The mitral valve is abnormal. Mild mitral valve regurgitation.  6. The aortic valve is  tricuspid. Aortic valve regurgitation is trivial.  Mild aortic valve sclerosis is present, with no evidence of aortic valve  stenosis.   Comparison(s): Changes from prior study are noted. 07/27/2020: LVEF 42%.    Recent Labs: 11/16/2020: ALT 33 12/19/2020: BUN 8; Creatinine, Ser 0.95; Potassium 3.4; Sodium 145  Recent Lipid Panel    Component Value Date/Time   CHOL 116 11/16/2020 0904   TRIG 56 11/16/2020 0904   HDL 54 11/16/2020 0904   CHOLHDL 2.1 11/16/2020 0904   LDLCALC 49 11/16/2020 0904     Risk Assessment/Calculations:      Physical Exam:    VS:  BP  130/70 (BP Location: Left Arm, Patient Position: Sitting, Cuff Size: Normal)   Pulse 76   Ht 5\' 6"  (1.676 m)   Wt 132 lb (59.9 kg)   BMI 21.31 kg/m     Wt Readings from Last 3 Encounters:  01/15/21 132 lb (59.9 kg)  10/31/20 138 lb (62.6 kg)  09/27/20 139 lb (63 kg)     GEN:  Well nourished, well developed in no acute distress HEENT: Normal NECK: No JVD; No carotid bruits LYMPHATICS: No lymphadenopathy CARDIAC: RRR, no murmurs, rubs, gallops RESPIRATORY:  Clear to auscultation without rales, wheezing or rhonchi  ABDOMEN: Soft, non-tender, non-distended MUSCULOSKELETAL:  Trace LE edema; No deformity  SKIN: Warm and dry NEUROLOGIC:  Alert and oriented x 3 PSYCHIATRIC:  Normal affect   ASSESSMENT:    1. Chronic systolic heart failure ([place])   2. Medication management   3. Coronary artery disease involving native coronary artery of native heart without angina pectoris    PLAN:    In order of problems listed above:  Nonischemic cardiomyopathy likely familial with chronic systolic heart failure - Had an episode in early April worsening shortness of breath.  Increased furosemide seem to help. - Had trouble affording Entresto.  Currently on moderate dose. - On spironolactone 12.5.  Continue with furosemide 40 mg daily now.  Diabetes with hypertension - Hemoglobin A1c 6.7.  On metformin.  Sometimes has GI  upset with this.  Per primary team.    Medication Adjustments/Labs and Tests Ordered: Current medicines are reviewed at length with the patient today.  Concerns regarding medicines are outlined above.  Orders Placed This Encounter  Procedures  . Basic metabolic panel   Meds ordered this encounter  Medications  . rosuvastatin (CRESTOR) 10 MG tablet    Sig: Take 1 tablet (10 mg total) by mouth daily.    Dispense:  90 tablet    Refill:  3    Patient Instructions  Medication Instructions:  The current medical regimen is effective;  continue present plan and medications.  *If you need a refill on your cardiac medications before your next appointment, please call your pharmacy*  Lab Work: Please have blood work today (BMP)  If you have labs (blood work) drawn today and your tests are completely normal, you will receive your results only by: [name] MyChart Message (if you have MyChart) OR . A paper copy in the mail If you have any lab test that is abnormal or we need to change your treatment, we will call you to review the results.  Follow-Up: At [facility], you and your health needs are our priority.  As part of our continuing mission to provide you with exceptional heart care, we have created designated Provider Care Teams.  These Care Teams include your primary Cardiologist (physician) and Advanced Practice Providers (APPs -  Physician Assistants and Nurse Practitioners) who all work together to provide you with the care you need, when you need it.  We recommend signing up for the patient portal called "MyChart".  Sign up information is provided on this After Visit Summary.  MyChart is used to connect with patients for Virtual Visits (Telemedicine).  Patients are able to view lab/test results, encounter notes, upcoming appointments, etc.  Non-urgent messages can be sent to your provider as well.   To learn more about what you can do with MyChart, go to [email or web address].    Your  next appointment:   6 month(s)  The format for your next appointment:  In Person  Provider:   Candee Furbish, MD   Thank you for choosing [facility]!!         Signed, Candee Furbish, MD  01/15/2021 9:53 AM    [place]

## 2021-01-15 NOTE — Patient Instructions (Signed)
Medication Instructions:  The current medical regimen is effective;  continue present plan and medications.  *If you need a refill on your cardiac medications before your next appointment, please call your pharmacy*  Lab Work: Please have blood work today (BMP)  If you have labs (blood work) drawn today and your tests are completely normal, you will receive your results only by: MyChart Message (if you have MyChart) OR A paper copy in the mail If you have any lab test that is abnormal or we need to change your treatment, we will call you to review the results.  Follow-Up: At [facility], you and your health needs are our priority.  As part of our continuing mission to provide you with exceptional heart care, we have created designated Provider Care Teams.  These Care Teams include your primary Cardiologist (physician) and Advanced Practice Providers (APPs -  Physician Assistants and Nurse Practitioners) who all work together to provide you with the care you need, when you need it.  We recommend signing up for the patient portal called "MyChart".  Sign up information is provided on this After Visit Summary.  MyChart is used to connect with patients for Virtual Visits (Telemedicine).  Patients are able to view lab/test results, encounter notes, upcoming appointments, etc.  Non-urgent messages can be sent to your provider as well.   To learn more about what you can do with MyChart, go to [email or web address].    Your next appointment:   6 month(s)  The format for your next appointment:   In Person  Provider:   Mark Skains, MD  Thank you for choosing [facility]!!    

## 2021-01-23 DIAGNOSIS — H25013 Cortical age-related cataract, bilateral: Secondary | ICD-10-CM | POA: Diagnosis not present

## 2021-01-23 DIAGNOSIS — H2513 Age-related nuclear cataract, bilateral: Secondary | ICD-10-CM | POA: Diagnosis not present

## 2021-02-07 ENCOUNTER — Telehealth: Payer: Self-pay | Admitting: Cardiology

## 2021-02-07 MED ORDER — POTASSIUM CHLORIDE CRYS ER 20 MEQ PO TBCR: 20.0000 meq | EXTENDED_RELEASE_TABLET | Freq: Every day | ORAL | 3 refills | Status: DC

## 2021-02-07 NOTE — Telephone Encounter (Signed)
*  STAT* If patient is at the pharmacy, call can be transferred to refill team.   1. Which medications need to be refilled? (please list name of each medication and dose if known) potassium chloride SA (KLOR-CON) 20 MEQ tablet  2. Which pharmacy/location (including street and city if local pharmacy) is medication to be sent to?[facility] [place], [place] AT Portal to [facility]  3. Do they need a 30 day or 90 day supply? 90 day supply   Pt is out of this medication

## 2021-02-07 NOTE — Telephone Encounter (Signed)
Refill for Potassium sent to [facility] per pt request.

## 2021-03-13 ENCOUNTER — Telehealth: Payer: Self-pay | Admitting: Cardiology

## 2021-03-13 DIAGNOSIS — Z79899 Other long term (current) drug therapy: Secondary | ICD-10-CM

## 2021-03-13 NOTE — Telephone Encounter (Signed)
Pt c/o Shortness Of Breath: STAT if SOB developed within the last 24 hours or pt is noticeably SOB on the phone  1. Are you currently SOB (can you hear that pt is SOB on the phone)? No; patient sat down due to SOB  2. How long have you been experiencing SOB? 3 days and has gradually gotten worse  3. Are you SOB when sitting or when up moving around? Moving around  4. Are you currently experiencing any other symptoms? No

## 2021-03-13 NOTE — Telephone Encounter (Signed)
Spoke with the patient who states that she has had increased SOB over the past several days. She states SOB is only with exertion when she is walking around. She has some slight swelling in her feet. Denies chest pain or weight gain. She states that her BP has been good but does not have any readings to provide.  Advised patient on elevating legs for swelling.

## 2021-03-14 NOTE — Telephone Encounter (Signed)
Spoke with the patient who states that she is still having SOB with exertion. She still denies chest pain. She does have swelling in her feet at the end of the day. She states blood pressure has been 130/70s. She denies any weight gain but reports she does not feel like she is urinating as frequently. She denies any lightheadedness or dizziness. She has previously increased lasix to 40mg  BID for a couple days which has helped with her symptoms and would like to try that again. Advised to call back if no improvement.

## 2021-03-14 NOTE — Telephone Encounter (Signed)
PT is calling back to find out the status of whats going on

## 2021-03-15 NOTE — Telephone Encounter (Signed)
Increase furosemide to 40 mg po BID. BMET in 5 days.

## 2021-03-21 MED ORDER — FUROSEMIDE 40 MG PO TABS: 40.0000 mg | ORAL_TABLET | Freq: Two times a day (BID) | ORAL | 3 refills | Status: DC

## 2021-03-21 NOTE — Telephone Encounter (Signed)
Spoke with the patient and she has been taking Lasxi 40 mg BID and states that she is feeling much better. SOB and swelling have improved. BMET has been scheduled.

## 2021-03-23 ENCOUNTER — Other Ambulatory Visit: Payer: Self-pay

## 2021-03-23 ENCOUNTER — Other Ambulatory Visit: Payer: Medicare Other | Admitting: *Deleted

## 2021-03-23 DIAGNOSIS — Z79899 Other long term (current) drug therapy: Secondary | ICD-10-CM | POA: Diagnosis not present

## 2021-03-23 LAB — BASIC METABOLIC PANEL
BUN/Creatinine Ratio: 13 (ref 12–28)
BUN: 15 mg/dL (ref 8–27)
CO2: 26 mmol/L (ref 20–29)
Calcium: 9.3 mg/dL (ref 8.7–10.3)
Chloride: 105 mmol/L (ref 96–106)
Creatinine, Ser: 1.13 mg/dL — ABNORMAL HIGH (ref 0.57–1.00)
Glucose: 126 mg/dL — ABNORMAL HIGH (ref 65–99)
Potassium: 4.1 mmol/L (ref 3.5–5.2)
Sodium: 145 mmol/L — ABNORMAL HIGH (ref 134–144)
eGFR: 49 mL/min/{1.73_m2} — ABNORMAL LOW (ref >59)

## 2021-04-03 ENCOUNTER — Telehealth: Payer: Self-pay

## 2021-04-03 DIAGNOSIS — H2511 Age-related nuclear cataract, right eye: Secondary | ICD-10-CM | POA: Diagnosis not present

## 2021-04-03 DIAGNOSIS — H18413 Arcus senilis, bilateral: Secondary | ICD-10-CM | POA: Diagnosis not present

## 2021-04-03 DIAGNOSIS — H25013 Cortical age-related cataract, bilateral: Secondary | ICD-10-CM | POA: Diagnosis not present

## 2021-04-03 DIAGNOSIS — H2513 Age-related nuclear cataract, bilateral: Secondary | ICD-10-CM | POA: Diagnosis not present

## 2021-04-03 DIAGNOSIS — H25043 Posterior subcapsular polar age-related cataract, bilateral: Secondary | ICD-10-CM | POA: Diagnosis not present

## 2021-04-03 NOTE — Telephone Encounter (Signed)
   [facility] Pre-operative Risk Assessment    Patient Name: Catherine Leon  DOB: 03-20-1941 MRN: [id number]  HEARTCARE STAFF:  - IMPORTANT!!!!!! Under Visit Info/Reason for Call, type in Other and utilize the format Clearance MM/DD/YY or Clearance TBD. Do not use dashes or single digits. - Please review there is not already an duplicate clearance open for this procedure. - If request is for dental extraction, please clarify the # of teeth to be extracted. - If the patient is currently at the dentist's office, call Pre-Op Callback Staff (MA/nurse) to input urgent request.  - If the patient is not currently in the dentist office, please route to the Pre-Op pool.  Request for surgical clearance:  What type of surgery is being performed? Cararact Extraction with Intraocular Lens implantation of the Right eye  When is this surgery scheduled? 06/18/2021  What type of clearance is required (medical clearance vs. Pharmacy clearance to hold med vs. Both)? Both  Are there any medications that need to be held prior to surgery and how long? None Listed  Practice name and name of physician performing surgery? Dr Darleen Crocker [facility] [place]  What is the office phone number? [phone]   7.   What is the office fax number? [phone]  8.   Anesthesia type (None, local, MAC, general) ? Topical anesthesia with IV    Catherine Leon 04/03/2021, 5:29 PM  _________________________________________________________________   (provider comments below)

## 2021-04-04 NOTE — Telephone Encounter (Signed)
   Patient Name: Catherine Leon  DOB: 04/15/1941 MRN: [id number]  Primary Cardiologist: Candee Furbish, MD  Chart reviewed as part of pre-operative protocol coverage. Request is for cataract extraction with intraocular lens implantation of the right eye. While cataract extractions are recognized in guidelines as low risk surgeries that do not typically require specific preoperative testing or holding of blood thinner therapy, it is noted that the request is also for intraocular lens implantation.  She has history of chronic systolic HF, and CAD. EF improved on recent echo 10/17/20 to 45-50%.   She was last seen in office 01/15/21 by Dr. Marlou Porch, at which time she reported occasional SOB and activity NYHA class II sx. She was continued on her current diuretic regimen at that time.   She called the office with DOE and pedal edema 03/13/21. Lasix was increased to 40mg  po BID. Follow-up with pt 03/21/21 reported improvement in sx. Follow-up labs stable.  Attempted to call pt 04/04/21 at reached a busy signal only. Will try again later.   Arvil Chaco, PA-C 04/04/2021, 12:25 PM

## 2021-04-12 NOTE — Telephone Encounter (Signed)
   Patient Name: Catherine Leon  DOB: November 08, 1940 MRN: [id number]  Primary Cardiologist: Candee Furbish, MD  Chart reviewed as part of pre-operative protocol coverage. Cataract extractions are recognized in guidelines as low risk surgeries that do not typically require specific preoperative testing or holding of blood thinner therapy. Therefore, given past medical history and time since last visit, based on ACC/AHA guidelines, Catherine Leon would be at acceptable risk for the planned procedure without further cardiovascular testing.   I will route this recommendation to the requesting party via Epic fax function and remove from pre-op pool.  Please call with questions.  Deberah Pelton, NP 04/12/2021, 1:35 PM

## 2021-04-27 ENCOUNTER — Other Ambulatory Visit: Payer: Self-pay | Admitting: General Surgery

## 2021-04-27 DIAGNOSIS — Z1231 Encounter for screening mammogram for malignant neoplasm of breast: Secondary | ICD-10-CM

## 2021-06-18 DIAGNOSIS — H25811 Combined forms of age-related cataract, right eye: Secondary | ICD-10-CM | POA: Diagnosis not present

## 2021-06-18 DIAGNOSIS — H2511 Age-related nuclear cataract, right eye: Secondary | ICD-10-CM | POA: Diagnosis not present

## 2021-06-21 ENCOUNTER — Other Ambulatory Visit: Payer: Self-pay

## 2021-06-21 MED ORDER — FUROSEMIDE 40 MG PO TABS
40.0000 mg | ORAL_TABLET | Freq: Two times a day (BID) | ORAL | 2 refills | Status: DC
Start: 2021-06-21 — End: 2022-01-30

## 2021-07-09 DIAGNOSIS — I10 Essential (primary) hypertension: Secondary | ICD-10-CM | POA: Diagnosis not present

## 2021-07-09 DIAGNOSIS — E7801 Familial hypercholesterolemia: Secondary | ICD-10-CM | POA: Diagnosis not present

## 2021-07-09 DIAGNOSIS — E119 Type 2 diabetes mellitus without complications: Secondary | ICD-10-CM | POA: Diagnosis not present

## 2021-07-13 DIAGNOSIS — N1831 Chronic kidney disease, stage 3a: Secondary | ICD-10-CM | POA: Diagnosis not present

## 2021-07-13 DIAGNOSIS — E010 Iodine-deficiency related diffuse (endemic) goiter: Secondary | ICD-10-CM | POA: Diagnosis not present

## 2021-07-13 DIAGNOSIS — Z0000 Encounter for general adult medical examination without abnormal findings: Secondary | ICD-10-CM | POA: Diagnosis not present

## 2021-07-13 DIAGNOSIS — I5022 Chronic systolic (congestive) heart failure: Secondary | ICD-10-CM | POA: Diagnosis not present

## 2021-07-13 DIAGNOSIS — I10 Essential (primary) hypertension: Secondary | ICD-10-CM | POA: Diagnosis not present

## 2021-07-13 DIAGNOSIS — E261 Secondary hyperaldosteronism: Secondary | ICD-10-CM | POA: Diagnosis not present

## 2021-07-13 DIAGNOSIS — E1121 Type 2 diabetes mellitus with diabetic nephropathy: Secondary | ICD-10-CM | POA: Diagnosis not present

## 2021-07-16 ENCOUNTER — Other Ambulatory Visit: Payer: Self-pay | Admitting: Internal Medicine

## 2021-07-16 DIAGNOSIS — E010 Iodine-deficiency related diffuse (endemic) goiter: Secondary | ICD-10-CM

## 2021-07-19 ENCOUNTER — Ambulatory Visit
Admission: RE | Admit: 2021-07-19 | Discharge: 2021-07-19 | Disposition: A | Discharge status: Home / Self Care | Payer: Medicare Other | Source: Ambulatory Visit | Attending: General Surgery | Admitting: General Surgery

## 2021-07-19 ENCOUNTER — Other Ambulatory Visit: Payer: Self-pay

## 2021-07-19 DIAGNOSIS — Z1231 Encounter for screening mammogram for malignant neoplasm of breast: Secondary | ICD-10-CM

## 2021-07-24 DIAGNOSIS — Z23 Encounter for immunization: Secondary | ICD-10-CM | POA: Diagnosis not present

## 2021-07-31 ENCOUNTER — Ambulatory Visit
Admission: RE | Admit: 2021-07-31 | Discharge: 2021-07-31 | Disposition: A | Discharge status: Home / Self Care | Payer: Medicare Other | Source: Ambulatory Visit | Attending: Internal Medicine | Admitting: Internal Medicine

## 2021-07-31 DIAGNOSIS — E010 Iodine-deficiency related diffuse (endemic) goiter: Secondary | ICD-10-CM

## 2021-07-31 DIAGNOSIS — E041 Nontoxic single thyroid nodule: Secondary | ICD-10-CM | POA: Diagnosis not present

## 2021-08-13 ENCOUNTER — Telehealth: Payer: Self-pay

## 2021-08-13 NOTE — Telephone Encounter (Signed)
Pt came in with her Husband today (he had appt with [facility]). She brought in her Pt Asst paperwork completed. Dr Marlou Porch signed his portion and all completed forms were faxed to [employer].

## 2021-08-21 DIAGNOSIS — I5022 Chronic systolic (congestive) heart failure: Secondary | ICD-10-CM | POA: Diagnosis not present

## 2021-08-21 DIAGNOSIS — I10 Essential (primary) hypertension: Secondary | ICD-10-CM | POA: Diagnosis not present

## 2021-08-21 DIAGNOSIS — E7800 Pure hypercholesterolemia, unspecified: Secondary | ICD-10-CM | POA: Diagnosis not present

## 2021-08-21 DIAGNOSIS — E1121 Type 2 diabetes mellitus with diabetic nephropathy: Secondary | ICD-10-CM | POA: Diagnosis not present

## 2021-08-24 ENCOUNTER — Encounter: Payer: Self-pay | Admitting: Physician Assistant

## 2021-08-24 NOTE — Progress Notes (Signed)
Cardiology Office Note    Date:  08/27/2021   ID:  Catherine Leon, DOB 07-Jun-1941, MRN [id number]  PCP:  Deland Pretty, MD  Cardiologist:  Candee Furbish, MD  Electrophysiologist:  None   Chief Complaint: 6 month f/u for chronic combined systolic and diastolic heart failure  History of Present Illness:   Catherine Leon is a 80 y.o. female with history of chronic combined CHF, NICM, breast CA, CKD stage 3a, mild CAD by cor CT 2021, anemia, sickle cell, HTN, HLD, DM, thyroid nodules (followed by PCP), mild MR who presents for follow-up. She has a remote history of palpitations and PVCs. She established care with Dr. Marlou Porch in 07/2020 for SOB. Echo by primary care showed EF 42%. Coronary CTA showed mild nonobstructive CAD in several areas. Her medications were adjusted and she was switched to [facility]. Repeat echo 10/2020 showed EF 45-50%, elevated LVEDP, grade 2 DD, apical false tendon, mildly reduced RV function, mild MR, mild-moderate RA. Given family history of cardiomyopathy Dr. Marlou Porch has felt hers was likely familial.  Today, she is here alone for follow-up of heart failure. She reports she has been feeling well and has been active bowling exercising and doing house and yard work. She reports that shortly after her last ov with Dr. Marlou Porch, she called Dr. Lambert Mody office with concerns for leg swelling that went up to her knees bilaterally. He placed her on 80 mg of Lasix twice daily and when the swelling improved she was advised to reduce the dose of Lasix to 40 mg twice a day. She states that she tried taking 40 mg daily and some of the swelling returned She reports that recently her blood pressure readings at home have been low and she was advised by Dr. Lambert Mody office to stop her metoprolol and spironolactone. She had lightheadedness and unsteadiness with walking associated with the lower BP readings. She was recently started on Farxiga.  She denies chest pain, shortness of breath, lower extremity  edema, fatigue, palpitations, melena, weakness, presyncope, syncope, orthopnea, and PND.   Labwork independently reviewed: From PCP records: 07/09/21  K 4.1, Cr 1.32 07/09/21 AST 21, ALT 16, Hbg A1C 7.4 07/09/21 LDL 67, HDL 83, Trigs 62 07/09/21 hgb 11.9, plt 226 07/19/21 TSH 1.74, thyroid u/s 07/31/21   Past Medical History:  Diagnosis Date   Anemia    BMI 27.0-27.9,adult    Breast cancer (HCC)    Chronic kidney disease, stage 3a ([place])    Constipation    Contact lens/glasses fitting    wears contacts or glasses   Diabetes mellitus    Family history of early CAD    Goiter    Hx of breast cancer 05/04/2012   Hx of radiation therapy    Hyperlipidemia    Hypertension    Mild CAD    Mild mitral regurgitation    Osteoarthritis    Palpitations    Personal history of chemotherapy 2001   Personal history of radiation therapy 2001   Post-menopausal    Sickle cell anemia (HCC)    Urinary frequency    Wears partial dentures    partial top   Weight loss     Past Surgical History:  Procedure Laterality Date   BREAST LUMPECTOMY Right 2001   BREAST SURGERY  07/16/2000   rt lump-axillary dissectopn   CHOLECYSTECTOMY  1995 - approximate   COLONOSCOPY     LESION EXCISION WITH COMPLEX REPAIR Left 10/22/2016   Procedure: COMPLEX REPAIR LEFT CHEST 10  CM;  Surgeon: Irene Limbo, MD;  Location: [place];  Service: Plastics;  Laterality: Left;   MASS EXCISION Right 06/15/2013   Procedure: EXCISION right neck MASS;  Surgeon: Adin Hector, MD;  Location: [place];  Service: General;  Laterality: Right;   MASTECTOMY  12/11/2009   RIGHT BREAST   TONSILLECTOMY      Current Medications: Current Meds  Medication Sig   furosemide (LASIX) 40 MG tablet Take 1 tablet (40 mg total) by mouth 2 (two) times daily.   metFORMIN (GLUCOPHAGE-XR) 750 MG 24 hr tablet Take 750 mg by mouth daily.   potassium chloride SA (KLOR-CON) 20 MEQ tablet Take 1 tablet (20  mEq total) by mouth daily.   rosuvastatin (CRESTOR) 10 MG tablet Take 1 tablet (10 mg total) by mouth daily.   sacubitril-valsartan (ENTRESTO) 49-51 MG Take 1 tablet by mouth 2 (two) times daily.    Allergies:   Dapagliflozin-metformin hcl er   Social History   Socioeconomic History   Marital status: Married    Spouse name: Not on file   Number of children: Not on file   Years of education: Not on file   Highest education level: Not on file  Occupational History   Not on file  Tobacco Use   Smoking status: Former    Types: Cigarettes    Quit date: 11/04/1982    Years since quitting: 38.8   Smokeless tobacco: Never  Substance and Sexual Activity   Alcohol use: Yes    Comment: WINE. AVERAGE 1 GLASS A MONTH   Drug use: No   Sexual activity: Not on file  Other Topics Concern   Not on file  Social History Narrative   Not on file   Social Determinants of Health   Financial Resource Strain: Not on file  Food Insecurity: Not on file  Transportation Needs: Not on file  Physical Activity: Not on file  Stress: Not on file  Social Connections: Not on file     Family History:  The patient's family history includes Breast cancer in her paternal aunt; Cancer in her paternal aunt; Diabetes in her father; Heart disease in her mother; Kidney disease in her father.  ROS:   Please see the history of present illness.  All other systems are reviewed and otherwise negative.    EKGs/Labs/Other Studies Reviewed:    Studies reviewed are outlined and summarized above. Reports included below if pertinent.  2d echo 10/2020   1. Mitral b-bump in m-mode, suggestive of elevated LVEDP. LV Apical false  tendon. Left ventricular ejection fraction, by estimation, is 45 to 50%.  The left ventricle has mildly decreased function. The left ventricle  demonstrates global hypokinesis. Left   ventricular diastolic parameters are consistent with Grade II diastolic  dysfunction (pseudonormalization).  Elevated left ventricular end-diastolic  pressure. The E/e' is 25. The average left ventricular global longitudinal  strain is -11.3 %. The global  longitudinal strain is abnormal.   2. Right ventricular systolic function is mildly reduced. The right  ventricular size is normal.   3. Left atrial size was moderately dilated.   4. Right atrial size was mild to moderately dilated.   5. The mitral valve is abnormal. Mild mitral valve regurgitation.   6. The aortic valve is tricuspid. Aortic valve regurgitation is trivial.  Mild aortic valve sclerosis is present, with no evidence of aortic valve  stenosis.   Comparison(s): Changes from prior study are noted. 07/27/2020: LVEF 42%.  Cor CT 07/2020 ADDENDUM REPORT: 08/13/2020 19:33   CLINICAL DATA:  80 year old female with h/o hypertension, hyperlipidemia and dyspnea on exertion.   EXAM: Cardiac/Coronary  CTA   TECHNIQUE: The patient was scanned on a [employer].   FINDINGS: A 100 kV prospective scan was triggered in the descending thoracic aorta at 111 HU's. Axial non-contrast 3 mm slices were carried out through the heart. The data set was analyzed on a dedicated work station and scored using the [place]. Gantry rotation speed was 250 msecs and collimation was .6 mm. 50 mg of PO Metoprolol and 0.8 mg of sl NTG was given. The 3D data set was reconstructed in 5% intervals of the 67-82 % of the R-R cycle. Diastolic phases were analyzed on a dedicated work station using MPR, MIP and VRT modes. The patient received 80 cc of contrast.   Aorta: Normal size. Mild diffuse calcifications and atherosclerotic plaque. No dissection.   Aortic Valve:  Trileaflet.  No calcifications.   Coronary Arteries:  Normal coronary origin.  Left dominance.   RCA is a small caliber non-dominant artery that has minimal plaque.   Left main is a large artery that gives rise to LAD and LCX arteries. Left main has minimal eccentric  calcified plaque in the distal portion with stenosis 0-25%.   LAD is a large vessel that has minimal calcified plaque in the proximal portion with stenosis 0-25% followed by a mild non-calcified plaque with stenosis 25-49%. Mid and distal LAD has only minimal plaque.   LCX is a large dominant artery that gives rise to one small OM1 branch, PDA and PLA. There is mild calcified plaque in the proximal portion with stenosis 25-49% and mild non-calcified plaque in the mid portion with stenosis 25-49%.   OM1, PDA and PLA have no obvious plaque.   Other findings:   Normal pulmonary vein drainage into the left atrium.   Normal left atrial appendage with a small filling defect in the most supero-anterior portion, that most likely represents incomplete contrast mixing.   Normal size of the pulmonary artery.   IMPRESSION: 1. Coronary calcium score of 26. This was 67 percentile for age and sex matched control.   2. Normal coronary origin with left dominance.   3. CAD-RADS 2. Mild non-obstructive CAD (25-49%). Consider non-atherosclerotic causes of chest pain. Consider preventive therapy and risk factor modification.     Electronically Signed   By: Ena Dawley   On: 08/13/2020 19:33   IMPRESSION: Cardiomegaly.   Small bilateral pleural effusions, right greater than left.   Electronically Signed: By: Rolm Baptise M.D. On: 08/11/2020 11:22        EKG:  EKG is ordered today, personally reviewed, demonstrating NSR with occ PACs, NSIVCD, no ST/T wave abnormality  Recent Labs: From PCP 07/19/21 TSH 1.74, thyroid u/s 07/31/21 07/09/21 K+ 4.1, creatinine 1.32, ALT 16, AST 21, A1C 7.4 07/09/21 LDL 67, trigs 67, HDL 83, hgb 11.9, plt 226  Recent Lipid Panel    Component Value Date/Time   CHOL 116 11/16/2020 0904   TRIG 56 11/16/2020 0904   HDL 54 11/16/2020 0904   CHOLHDL 2.1 11/16/2020 0904   LDLCALC 49 11/16/2020 0904    PHYSICAL EXAM:    VS:  BP (!) 98/50    Pulse 76   Ht 5\' 5"  (1.651 m)   Wt 131 lb (59.4 kg)   BMI 21.80 kg/m   BMI: Body mass index is 21.8 kg/m.  GEN: Well nourished, well  developed female in no acute distress HEENT: normocephalic, atraumatic Neck: no JVD, carotid bruits, or masses Cardiac: Irregular RR; no murmurs, rubs, or gallops, no edema  Respiratory:  clear to auscultation bilaterally, normal work of breathing GI: soft, nontender, nondistended, + BS MS: no deformity or atrophy Skin: warm and dry, no rash Neuro:  Alert and Oriented x 3, Strength and sensation are intact, follows commands Psych: euthymic mood, full affect  Wt Readings from Last 3 Encounters:  08/27/21 131 lb (59.4 kg)  01/15/21 132 lb (59.9 kg)  10/31/20 138 lb (62.6 kg)     ASSESSMENT & PLAN:   Chronic combined systolic and diastolic CHF/NICM: LVEF 49-44% by echo 2/22. She appears euvolemic on exam today. She called PCP in March 2022 with concerns about increased leg swelling up to her knees bilaterally and Lasix was increased to 80 mg BID. She has since reduced the Lasix to 40 mg BID. States leg swelling returned when she attempted to reduce Lasix to 40 mg daily. BP is soft today and creatinine is elevated at 1.32. Advised her to alternate Lasix 40 mg daily and 80 mg daily and monitor leg swelling and weight. Advised she may return to taking 80 mg daily if swelling returns. Advised her to monitor BP as noted below. Continue Catherine Leon, Lasix. Would consider repeat echo at next ov, sooner if recurrent issues arise.  Essential hypertension with CKD Stage 3a by labs: Patient reports PCP reported that her creatinine is recently elevated. Her BP is soft today. She was advised to stop spironolactone and metoprolol by PCP. Upon my recheck BP is 118/62.  She has a home monitor and will continue to monitor her blood pressure regularly.  Encouraged her to notify us if she is getting readings less than 967 mmHg systolic or if symptoms of hypotension return.    Mild CAD with hyperlipidemia goal < 70: She denies angina. Is active exercising regularly without concerns. Discussed restarting metoprolol in the setting of GDMT for CAD and frequent PACs. Patient would favor not restarting at this time. Not on aspirin due to lack of obstructive coronary disease. LDL 67 10/22. Continue rosuvastatin.   PACs: She has occasional PACs on EKG today and irregular HR is noted upon exam. We discussed resuming metoprolol which was recently stopped by PCP at a full or reduced dose. She is presently asymptomatic and would prefer not to resume metoprolol at this time.  States she will keep this in mind for the future and let us know if symptoms worsen.  Mild mitral regurgitation: By echo 2/22. I do not appreciate a murmur today. Continue to follow. Would consider repeat echo in the setting of chronic combined HF at next visit.    Disposition: F/u with Dr. Marlou Porch in 6 months   Medication Adjustments/Labs and Tests Ordered: Current medicines are reviewed at length with the patient today.  Concerns regarding medicines are outlined above. Medication changes, Labs and Tests ordered today are summarized above and listed in the Patient Instructions accessible in Encounters.   Signed, Emmaline Life, NP  08/27/2021 4:50 PM    [place] Phone: [phone]; Fax: [phone]

## 2021-08-27 ENCOUNTER — Encounter: Payer: Self-pay | Admitting: Physician Assistant

## 2021-08-27 ENCOUNTER — Ambulatory Visit (INDEPENDENT_AMBULATORY_CARE_PROVIDER_SITE_OTHER): Payer: Medicare Other | Admitting: Nurse Practitioner

## 2021-08-27 ENCOUNTER — Other Ambulatory Visit: Payer: Self-pay

## 2021-08-27 VITALS — BP 98/50 | HR 76 | Ht 65.0 in | Wt 131.0 lb

## 2021-08-27 DIAGNOSIS — I5042 Chronic combined systolic (congestive) and diastolic (congestive) heart failure: Secondary | ICD-10-CM

## 2021-08-27 DIAGNOSIS — I2510 Atherosclerotic heart disease of native coronary artery without angina pectoris: Secondary | ICD-10-CM | POA: Diagnosis not present

## 2021-08-27 DIAGNOSIS — E785 Hyperlipidemia, unspecified: Secondary | ICD-10-CM

## 2021-08-27 DIAGNOSIS — I10 Essential (primary) hypertension: Secondary | ICD-10-CM

## 2021-08-27 DIAGNOSIS — N1831 Chronic kidney disease, stage 3a: Secondary | ICD-10-CM | POA: Diagnosis not present

## 2021-08-27 DIAGNOSIS — I340 Nonrheumatic mitral (valve) insufficiency: Secondary | ICD-10-CM | POA: Diagnosis not present

## 2021-08-27 DIAGNOSIS — I428 Other cardiomyopathies: Secondary | ICD-10-CM

## 2021-08-27 NOTE — Patient Instructions (Addendum)
Medication Instructions:  Your physician recommends that you continue on your current medications as directed. Please refer to the Current Medication list given to you today. You may reduce your Lasix (Furosemide) to 40 mg daily instead of 40 mg twice daily every other day   *If you need a refill on your cardiac medications before your next appointment, please call your pharmacy*   Lab Work: None ordered  If you have labs (blood work) drawn today and your tests are completely normal, you will receive your results only by: [place] (if you have MyChart) OR A paper copy in the mail If you have any lab test that is abnormal or we need to change your treatment, we will call you to review the results.   Testing/Procedures: None ordered   Follow-Up: At [facility], you and your health needs are our priority.  As part of our continuing mission to provide you with exceptional heart care, we have created designated Provider Care Teams.  These Care Teams include your primary Cardiologist (physician) and Advanced Practice Providers (APPs -  Physician Assistants and Nurse Practitioners) who all work together to provide you with the care you need, when you need it.  We recommend signing up for the patient portal called "MyChart".  Sign up information is provided on this After Visit Summary.  MyChart is used to connect with patients for Virtual Visits (Telemedicine).  Patients are able to view lab/test results, encounter notes, upcoming appointments, etc.  Non-urgent messages can be sent to your provider as well.   To learn more about what you can do with MyChart, go to [email or web address].    Your next appointment:   6 month(s)  February 19, 2022 arrive at 9:30   The format for your next appointment:   In Person  Provider:   Candee Furbish, MD     Other Instructions Magnesium succinate supplement Vitex (Chaste berry)

## 2021-08-31 ENCOUNTER — Telehealth: Payer: Self-pay | Admitting: Cardiology

## 2021-08-31 NOTE — Telephone Encounter (Signed)
° °  Pt is calling back she said, she received an email from pt assistance that her 1040 form is missing and the provide's information is incomplete. She said she knows she provided her 74 form so it be at Dr. Marlou Porch office

## 2021-09-03 NOTE — Telephone Encounter (Signed)
° °  Pt is calling back to f/u. She asked if April can call her today

## 2021-09-03 NOTE — Telephone Encounter (Signed)
LM for pt to return my call

## 2021-09-05 NOTE — Telephone Encounter (Signed)
Pt came in with her Husband today for his appt and brought her 1040 for [place] to fax to [employer] to go with her Pt Asst application.  I have faxed her information and received confirmation.

## 2021-09-11 ENCOUNTER — Other Ambulatory Visit: Payer: Self-pay

## 2021-09-11 MED ORDER — SACUBITRIL-VALSARTAN 49-51 MG PO TABS: 1.0000 | ORAL_TABLET | Freq: Two times a day (BID) | ORAL | 3 refills | Status: DC

## 2021-09-11 MED ORDER — SACUBITRIL-VALSARTAN 49-51 MG PO TABS
1.0000 | ORAL_TABLET | Freq: Two times a day (BID) | ORAL | 3 refills | Status: DC
Start: 2021-09-11 — End: 2021-09-11

## 2021-09-19 ENCOUNTER — Ambulatory Visit: Payer: Medicare Other | Admitting: Cardiology

## 2021-10-02 DIAGNOSIS — Z1212 Encounter for screening for malignant neoplasm of rectum: Secondary | ICD-10-CM | POA: Diagnosis not present

## 2021-10-02 DIAGNOSIS — Z01419 Encounter for gynecological examination (general) (routine) without abnormal findings: Secondary | ICD-10-CM | POA: Diagnosis not present

## 2021-10-02 DIAGNOSIS — I10 Essential (primary) hypertension: Secondary | ICD-10-CM | POA: Diagnosis not present

## 2021-10-23 DIAGNOSIS — E119 Type 2 diabetes mellitus without complications: Secondary | ICD-10-CM | POA: Diagnosis not present

## 2021-11-13 DIAGNOSIS — I5022 Chronic systolic (congestive) heart failure: Secondary | ICD-10-CM | POA: Diagnosis not present

## 2021-11-13 DIAGNOSIS — E7800 Pure hypercholesterolemia, unspecified: Secondary | ICD-10-CM | POA: Diagnosis not present

## 2021-11-13 DIAGNOSIS — E1121 Type 2 diabetes mellitus with diabetic nephropathy: Secondary | ICD-10-CM | POA: Diagnosis not present

## 2021-11-28 DIAGNOSIS — E1121 Type 2 diabetes mellitus with diabetic nephropathy: Secondary | ICD-10-CM | POA: Diagnosis not present

## 2021-11-28 DIAGNOSIS — I10 Essential (primary) hypertension: Secondary | ICD-10-CM | POA: Diagnosis not present

## 2021-11-28 DIAGNOSIS — E7800 Pure hypercholesterolemia, unspecified: Secondary | ICD-10-CM | POA: Diagnosis not present

## 2021-11-28 DIAGNOSIS — R232 Flushing: Secondary | ICD-10-CM | POA: Diagnosis not present

## 2021-11-28 DIAGNOSIS — I5022 Chronic systolic (congestive) heart failure: Secondary | ICD-10-CM | POA: Diagnosis not present

## 2021-12-12 ENCOUNTER — Telehealth: Payer: Self-pay | Admitting: Cardiology

## 2021-12-12 MED ORDER — ENTRESTO 24-26 MG PO TABS
1.0000 | ORAL_TABLET | Freq: Two times a day (BID) | ORAL | 3 refills | Status: DC
Start: 2021-12-12 — End: 2022-04-16

## 2021-12-12 NOTE — Telephone Encounter (Signed)
Pt c/o BP issue: STAT if pt c/o blurred vision, one-sided weakness or slurred speech ? ?1. What are your last 5 BP readings?  ?No readings available ? ?2. Are you having any other symptoms (ex. Dizziness, headache, blurred vision, passed out)?  ?Mild lightheadedness ? ?3. What is your BP issue?  ? ?Patient states her BP has been low, ranging in the 80's/50's and highest lately is around 107/90. ? ?Referral also received from Dr. Shelia Media to consider decreasing entresto and restarting metoprolol. Patient currently scheduled for 6/06 with Dr. Marlou Porch and preferred to keep that appointment instead of scheduling with an APP for sooner. ? ?

## 2021-12-12 NOTE — Telephone Encounter (Signed)
Pt returning a phone call... please advise ?

## 2021-12-12 NOTE — Telephone Encounter (Signed)
Left message for patient to call back  

## 2021-12-12 NOTE — Telephone Encounter (Signed)
Spoke with the patient who reports the following recent BP readings: ?3/18: 101/60, 66; 105/60, 105/64, 70 ?3/19: 84/57, 73 ?3/21: 97/60, 65 ?3/25: 86/53, 72 ?3/26: 103/62, 61 ?3/28: 89/54, 66 ?Patient took blood pressure while we were on the phone and it was 120/66, she states that she has been running errands and just ate lunch. This is the highest reading she has gotten in a while.  ?She states that she is not having any symptoms of dizziness, lightheadedness, fatigue, or unsteadiness. She has felt good since stopping Toprol and spironolactone.  ?She continues Entresto, Lasix and farxiga.  ?She is able to complete her normal activities such as bowling and cleaning without any issues.  ?Dr. Shelia Media had suggested decreasing some of her medications. Advised patient that I would send a message to Dr. Marlou Porch about any adjustments to her medications and call her back with recommendations.  ?

## 2021-12-12 NOTE — Telephone Encounter (Signed)
Spoke with the patient and advised her to decrease Entresto per Dr. Marlou Porch. Patient verbalized understanding and will continue to keep track of her BP's ?

## 2022-01-30 ENCOUNTER — Encounter: Payer: Self-pay | Admitting: Cardiology

## 2022-01-30 ENCOUNTER — Ambulatory Visit (INDEPENDENT_AMBULATORY_CARE_PROVIDER_SITE_OTHER): Payer: Medicare Other | Admitting: Cardiology

## 2022-01-30 DIAGNOSIS — I10 Essential (primary) hypertension: Secondary | ICD-10-CM

## 2022-01-30 DIAGNOSIS — I428 Other cardiomyopathies: Secondary | ICD-10-CM

## 2022-01-30 DIAGNOSIS — E119 Type 2 diabetes mellitus without complications: Secondary | ICD-10-CM | POA: Diagnosis not present

## 2022-01-30 MED ORDER — FUROSEMIDE 40 MG PO TABS: 40.0000 mg | ORAL_TABLET | Freq: Every day | ORAL | 3 refills | Status: DC

## 2022-01-30 NOTE — Assessment & Plan Note (Signed)
Improved control.  After she came back from [place], [place] with her daughter, her hemoglobin A1c was in the 7.5 range.  It is decreasing.  Continue with Wilder Glade as well as metformin. ?

## 2022-01-30 NOTE — Assessment & Plan Note (Signed)
Continue with Entresto at low-dose 24/26.  She now has capability of getting the medication.  It was expensive.  She was on a plan currently.  She is also on [place].  We will decrease Lasix to 40 mg once a day. ? ?Her blood pressures have improved.  At 1 point they were in the 78/57-80 range.  After cutting back the [facility] she is better. ? ?Next step would be to decrease her Lasix even more.  She really feels like she got the most benefit from the [place] previously.  We will try to keep this going. ?

## 2022-01-30 NOTE — Patient Instructions (Signed)
Medication Instructions:  ?Please decrease Furosemide to 40 mg a day. ?Continue all other medications as listed. ? ?*If you need a refill on your cardiac medications before your next appointment, please call your pharmacy* ? ? ?Follow-Up: ?At [facility], you and your health needs are our priority.  As part of our continuing mission to provide you with exceptional heart care, we have created designated Provider Care Teams.  These Care Teams include your primary Cardiologist (physician) and Advanced Practice Providers (APPs -  Physician Assistants and Nurse Practitioners) who all work together to provide you with the care you need, when you need it. ? ?We recommend signing up for the patient portal called "MyChart".  Sign up information is provided on this After Visit Summary.  MyChart is used to connect with patients for Virtual Visits (Telemedicine).  Patients are able to view lab/test results, encounter notes, upcoming appointments, etc.  Non-urgent messages can be sent to your provider as well.   ?To learn more about what you can do with MyChart, go to [email or web address].   ? ?Your next appointment:   ?6 month(s) ? ?The format for your next appointment:   ?In Person ? ?Provider:   ?Candee Furbish, MD { ? ? ?Important Information About Sugar ? ? ? ? ?  ?

## 2022-01-30 NOTE — Progress Notes (Signed)
?Cardiology Office Note:   ? ?Date:  01/30/2022  ? ?ID:  Catherine Leon, DOB [birth year], MRN [id number] ? ?PCP:  Deland Pretty, MD ?  ?[place]  ?Cardiologist:  Candee Furbish, MD  ?Advanced Practice Provider:  No care team member to display ?Electrophysiologist:  None  ?    ? ?Referring MD: Deland Pretty, MD  ? ? ? ?History of Present Illness:   ? ?Catherine Leon is a 81 y.o. female here for the follow-up of chronic systolic heart failure. ? ?She called the office 12/12/2021 reporting low blood pressures ranging in the 80's/50's to the 100's/90's, with some associated lightheadedness. Previously she did feel better since stopping Toprol and spironolactone. Her Entresto was decreased to 24/26 mg twice daily. ? ?Previously here for the evaluation of worsening edema shortness of breath. Increased Lasix from 20 mg once a day to 40 mg twice a day for 4 days back in April 7.  She felt better with the increase in furosemide having much less shortness of breath. Gained 10 pounds.  Did well with the increase in Lasix.  She also takes metformin which is a large pill she states.  Potassium is hard to swallow as well. ? ?Her creatinine on 12/19/2020 was 0.95 with potassium of 3.4.  Recent LDL 49 ? ?She has nonischemic cardiomyopathy, EF has improved from 42 to 50%.  Has occasional shortness of breath with activity NYHA class II-like symptoms.   ? ?Today: ?She is accompanied by a family member. She brings her BP machine today. Her most recent blood pressures are better, and she notes having no reactions to her BP or her medications. She is feeling better since decreasing her Entresto. ? ?She is taking 40 mg Lasix twice daily. Usually she takes this at night. She takes her potassium as well. ? ?Regarding her activity she has gone on walks and felt well with no dyspnea. ? ?She denies any palpitations, chest pain, or peripheral edema. No headaches, syncope, orthopnea, or PND. ? ? ?Past Medical History:   ?Diagnosis Date  ? Anemia   ? BMI 27.0-27.9,adult   ? Breast cancer ([place])   ? Chronic kidney disease, stage 3a ([place])   ? Constipation   ? Contact lens/glasses fitting   ? wears contacts or glasses  ? Diabetes mellitus   ? Family history of early CAD   ? Goiter   ? Hx of breast cancer 05/04/2012  ? Hx of radiation therapy   ? Hyperlipidemia   ? Hypertension   ? Mild CAD   ? Mild mitral regurgitation   ? Osteoarthritis   ? Palpitations   ? Personal history of chemotherapy 2001  ? Personal history of radiation therapy 2001  ? Post-menopausal   ? Sickle cell anemia ([place])   ? Urinary frequency   ? Wears partial dentures   ? partial top  ? Weight loss   ? ? ?Past Surgical History:  ?Procedure Laterality Date  ? BREAST LUMPECTOMY Right 2001  ? BREAST SURGERY  07/16/2000  ? rt lump-axillary dissectopn  ? CHOLECYSTECTOMY  1995 - approximate  ? COLONOSCOPY    ? LESION EXCISION WITH COMPLEX REPAIR Left 10/22/2016  ? Procedure: COMPLEX REPAIR LEFT CHEST 10 CM;  Surgeon: Irene Limbo, MD;  Location: [facility];  Service: Plastics;  Laterality: Left;  ? MASS EXCISION Right 06/15/2013  ? Procedure: EXCISION right neck MASS;  Surgeon: Adin Hector, MD;  Location: [facility];  Service:  General;  Laterality: Right;  ? MASTECTOMY  12/11/2009  ? RIGHT BREAST  ? TONSILLECTOMY    ? ? ?Current Medications: ?Current Meds  ?Medication Sig  ? FARXIGA 10 MG TABS tablet Take 10 mg by mouth daily.  ? metFORMIN (GLUCOPHAGE-XR) 750 MG 24 hr tablet Take 750 mg by mouth daily.  ? potassium chloride SA (KLOR-CON) 20 MEQ tablet Take 1 tablet (20 mEq total) by mouth daily.  ? rosuvastatin (CRESTOR) 10 MG tablet Take 1 tablet (10 mg total) by mouth daily.  ? sacubitril-valsartan (ENTRESTO) 24-26 MG Take 1 tablet by mouth 2 (two) times daily.  ? [DISCONTINUED] furosemide (LASIX) 40 MG tablet Take 1 tablet (40 mg total) by mouth 2 (two) times daily.  ?  ? ?Allergies:   Dapagliflozin-metformin hcl er  ? ?Social History   ? ?Socioeconomic History  ? Marital status: Married  ?  Spouse name: Not on file  ? Number of children: Not on file  ? Years of education: Not on file  ? Highest education level: Not on file  ?Occupational History  ? Not on file  ?Tobacco Use  ? Smoking status: Former  ?  Types: Cigarettes  ?  Quit date: 11/04/1982  ?  Years since quitting: 39.2  ? Smokeless tobacco: Never  ?Substance and Sexual Activity  ? Alcohol use: Yes  ?  Comment: WINE. AVERAGE 1 GLASS A MONTH  ? Drug use: No  ? Sexual activity: Not on file  ?Other Topics Concern  ? Not on file  ?Social History Narrative  ? Not on file  ? ?Social Determinants of Health  ? ?Financial Resource Strain: Not on file  ?Food Insecurity: Not on file  ?Transportation Needs: Not on file  ?Physical Activity: Not on file  ?Stress: Not on file  ?Social Connections: Not on file  ?  ? ?Family History: ?The patient's family history includes Breast cancer in her paternal aunt; Cancer in her paternal aunt; Diabetes in her father; Heart disease in her mother; Kidney disease in her father. ? ?ROS:   ?Please see the history of present illness.    ?All other systems reviewed and are negative. ? ?EKGs/Labs/Other Studies Reviewed:   ? ?The following studies were reviewed today: ? ?ECHO 10/17/20: ? ? 1. Mitral b-bump in m-mode, suggestive of elevated LVEDP. LV Apical false  ?tendon. Left ventricular ejection fraction, by estimation, is 45 to 50%.  ?The left ventricle has mildly decreased function. The left ventricle  ?demonstrates global hypokinesis. Left  ? ventricular diastolic parameters are consistent with Grade II diastolic  ?dysfunction (pseudonormalization). Elevated left ventricular end-diastolic  ?pressure. The E/e' is 25. The average left ventricular global longitudinal  ?strain is -11.3 %. The global  ?longitudinal strain is abnormal.  ? 2. Right ventricular systolic function is mildly reduced. The right  ?ventricular size is normal.  ? 3. Left atrial size was moderately  dilated.  ? 4. Right atrial size was mild to moderately dilated.  ? 5. The mitral valve is abnormal. Mild mitral valve regurgitation.  ? 6. The aortic valve is tricuspid. Aortic valve regurgitation is trivial.  ?Mild aortic valve sclerosis is present, with no evidence of aortic valve  ?stenosis.  ? ?Comparison(s): Changes from prior study are noted. 07/27/2020: LVEF 42%.  ? ?A CT scan of her coronary arteries was performed on 08/11/2020: ?IMPRESSION: ?1. Coronary calcium score of 26. This was 70 percentile for age and ?sex matched control. ?  ?2. Normal coronary origin with left dominance. ?  ?  3. CAD-RADS 2. Mild non-obstructive CAD (25-49%). Consider ?non-atherosclerotic causes of chest pain. Consider preventive ?therapy and risk factor modification. ?  ? ?EKG:  EKG is personally reviewed. ?01/30/2022: EKG was not ordered. ?08/27/2021 Christen Bame, NP): NSR with occ PACs, NSIVCD, no ST/T wave abnormality ? ?Recent Labs: ?03/23/2021: BUN 15; Creatinine, Ser 1.13; Potassium 4.1; Sodium 145  ? ?Recent Lipid Panel ?   ?Component Value Date/Time  ? CHOL 116 11/16/2020 0904  ? TRIG 56 11/16/2020 0904  ? HDL 54 11/16/2020 0904  ? CHOLHDL 2.1 11/16/2020 0904  ? LDLCALC 49 11/16/2020 0904  ? ? ? ?Risk Assessment/Calculations:   ? ? ? ?Physical Exam:   ? ?VS:  BP (!) 120/50 (BP Location: Left Arm, Patient Position: Sitting, Cuff Size: Normal)   Pulse 76   Ht '5\' 5"'$  (1.651 m)   Wt 131 lb (59.4 kg)   BMI 21.80 kg/m?    ? ?Wt Readings from Last 3 Encounters:  ?01/30/22 131 lb (59.4 kg)  ?08/27/21 131 lb (59.4 kg)  ?01/15/21 132 lb (59.9 kg)  ?  ? ?GEN:  Well nourished, well developed in no acute distress ?HEENT: Normal ?NECK: No JVD; No carotid bruits ?LYMPHATICS: No lymphadenopathy ?CARDIAC: RRR, no murmurs, rubs, gallops ?RESPIRATORY:  Clear to auscultation without rales, wheezing or rhonchi  ?ABDOMEN: Soft, non-tender, non-distended ?MUSCULOSKELETAL:  Trace LE edema; No deformity  ?SKIN: Warm and dry ?NEUROLOGIC:  Alert  and oriented x 3 ?PSYCHIATRIC:  Normal affect  ? ?ASSESSMENT:   ? ?1. Nonischemic cardiomyopathy ([place])   ?2. Diabetes mellitus with coincident hypertension ([place])   ? ? ?PLAN:   ? ?In order of problems listed above: ? ?

## 2022-02-15 ENCOUNTER — Other Ambulatory Visit: Payer: Self-pay | Admitting: Cardiology

## 2022-02-19 ENCOUNTER — Ambulatory Visit: Payer: Medicare Other | Admitting: Cardiology

## 2022-02-28 DIAGNOSIS — E1121 Type 2 diabetes mellitus with diabetic nephropathy: Secondary | ICD-10-CM | POA: Diagnosis not present

## 2022-02-28 DIAGNOSIS — E7800 Pure hypercholesterolemia, unspecified: Secondary | ICD-10-CM | POA: Diagnosis not present

## 2022-02-28 DIAGNOSIS — I5022 Chronic systolic (congestive) heart failure: Secondary | ICD-10-CM | POA: Diagnosis not present

## 2022-03-07 DIAGNOSIS — E1122 Type 2 diabetes mellitus with diabetic chronic kidney disease: Secondary | ICD-10-CM | POA: Diagnosis not present

## 2022-03-07 DIAGNOSIS — E7800 Pure hypercholesterolemia, unspecified: Secondary | ICD-10-CM | POA: Diagnosis not present

## 2022-03-07 DIAGNOSIS — I10 Essential (primary) hypertension: Secondary | ICD-10-CM | POA: Diagnosis not present

## 2022-03-07 DIAGNOSIS — N1832 Chronic kidney disease, stage 3b: Secondary | ICD-10-CM | POA: Diagnosis not present

## 2022-03-07 DIAGNOSIS — I5022 Chronic systolic (congestive) heart failure: Secondary | ICD-10-CM | POA: Diagnosis not present

## 2022-04-02 ENCOUNTER — Other Ambulatory Visit: Payer: Self-pay

## 2022-04-02 MED ORDER — POTASSIUM CHLORIDE CRYS ER 20 MEQ PO TBCR: 20.0000 meq | EXTENDED_RELEASE_TABLET | Freq: Every day | ORAL | 3 refills | Status: DC

## 2022-04-16 ENCOUNTER — Other Ambulatory Visit: Payer: Self-pay

## 2022-04-16 MED ORDER — ENTRESTO 24-26 MG PO TABS: 1.0000 | ORAL_TABLET | Freq: Two times a day (BID) | ORAL | 2 refills | Status: DC

## 2022-04-18 ENCOUNTER — Encounter (HOSPITAL_BASED_OUTPATIENT_CLINIC_OR_DEPARTMENT_OTHER): Payer: Self-pay

## 2022-04-18 ENCOUNTER — Emergency Department (HOSPITAL_BASED_OUTPATIENT_CLINIC_OR_DEPARTMENT_OTHER)
Admission: EM | Admit: 2022-04-18 | Discharge: 2022-04-18 | Disposition: A | Discharge status: Home / Self Care | Payer: Medicare Other | Attending: Emergency Medicine | Admitting: Emergency Medicine

## 2022-04-18 ENCOUNTER — Other Ambulatory Visit: Payer: Self-pay

## 2022-04-18 ENCOUNTER — Emergency Department (HOSPITAL_BASED_OUTPATIENT_CLINIC_OR_DEPARTMENT_OTHER): Payer: Medicare Other

## 2022-04-18 DIAGNOSIS — R944 Abnormal results of kidney function studies: Secondary | ICD-10-CM | POA: Diagnosis not present

## 2022-04-18 DIAGNOSIS — J069 Acute upper respiratory infection, unspecified: Secondary | ICD-10-CM | POA: Insufficient documentation

## 2022-04-18 DIAGNOSIS — I2510 Atherosclerotic heart disease of native coronary artery without angina pectoris: Secondary | ICD-10-CM | POA: Diagnosis not present

## 2022-04-18 DIAGNOSIS — N189 Chronic kidney disease, unspecified: Secondary | ICD-10-CM | POA: Diagnosis not present

## 2022-04-18 DIAGNOSIS — Z79899 Other long term (current) drug therapy: Secondary | ICD-10-CM | POA: Insufficient documentation

## 2022-04-18 DIAGNOSIS — E1122 Type 2 diabetes mellitus with diabetic chronic kidney disease: Secondary | ICD-10-CM | POA: Diagnosis not present

## 2022-04-18 DIAGNOSIS — Z7984 Long term (current) use of oral hypoglycemic drugs: Secondary | ICD-10-CM | POA: Insufficient documentation

## 2022-04-18 DIAGNOSIS — Z20822 Contact with and (suspected) exposure to covid-19: Secondary | ICD-10-CM | POA: Insufficient documentation

## 2022-04-18 DIAGNOSIS — I509 Heart failure, unspecified: Secondary | ICD-10-CM | POA: Insufficient documentation

## 2022-04-18 DIAGNOSIS — I130 Hypertensive heart and chronic kidney disease with heart failure and stage 1 through stage 4 chronic kidney disease, or unspecified chronic kidney disease: Secondary | ICD-10-CM | POA: Insufficient documentation

## 2022-04-18 DIAGNOSIS — R7989 Other specified abnormal findings of blood chemistry: Secondary | ICD-10-CM

## 2022-04-18 DIAGNOSIS — R059 Cough, unspecified: Secondary | ICD-10-CM | POA: Diagnosis not present

## 2022-04-18 HISTORY — DX: Raynaud's syndrome without gangrene: I73.00

## 2022-04-18 LAB — CBC WITH DIFFERENTIAL/PLATELET
Abs Immature Granulocytes: 0.02 10*3/uL (ref 0.00–0.07)
Basophils Absolute: 0 10*3/uL (ref 0.0–0.1)
Basophils Relative: 1 %
Eosinophils Absolute: 0.1 10*3/uL (ref 0.0–0.5)
Eosinophils Relative: 1 %
HCT: 35 % — ABNORMAL LOW (ref 36.0–46.0)
Hemoglobin: 11.8 g/dL — ABNORMAL LOW (ref 12.0–15.0)
Immature Granulocytes: 0 %
Lymphocytes Relative: 21 %
Lymphs Abs: 1.6 10*3/uL (ref 0.7–4.0)
MCH: 27.8 pg (ref 26.0–34.0)
MCHC: 33.7 g/dL (ref 30.0–36.0)
MCV: 82.5 fL (ref 80.0–100.0)
Monocytes Absolute: 0.7 10*3/uL (ref 0.1–1.0)
Monocytes Relative: 9 %
Neutro Abs: 5.1 10*3/uL (ref 1.7–7.7)
Neutrophils Relative %: 68 %
Platelets: 265 10*3/uL (ref 150–400)
RBC: 4.24 MIL/uL (ref 3.87–5.11)
RDW: 14.2 % (ref 11.5–15.5)
WBC: 7.6 10*3/uL (ref 4.0–10.5)
nRBC: 0 % (ref 0.0–0.2)

## 2022-04-18 LAB — COMPREHENSIVE METABOLIC PANEL
ALT: 14 U/L (ref 0–44)
AST: 19 U/L (ref 15–41)
Albumin: 3.7 g/dL (ref 3.5–5.0)
Alkaline Phosphatase: 91 U/L (ref 38–126)
Anion gap: 6 (ref 5–15)
BUN: 16 mg/dL (ref 8–23)
CO2: 28 mmol/L (ref 22–32)
Calcium: 9.1 mg/dL (ref 8.9–10.3)
Chloride: 108 mmol/L (ref 98–111)
Creatinine, Ser: 1.42 mg/dL — ABNORMAL HIGH (ref 0.44–1.00)
GFR, Estimated: 37 mL/min — ABNORMAL LOW (ref >60)
Glucose, Bld: 116 mg/dL — ABNORMAL HIGH (ref 70–99)
Potassium: 3.6 mmol/L (ref 3.5–5.1)
Sodium: 142 mmol/L (ref 135–145)
Total Bilirubin: 0.6 mg/dL (ref 0.3–1.2)
Total Protein: 7.4 g/dL (ref 6.5–8.1)

## 2022-04-18 LAB — RESP PANEL BY RT-PCR (FLU A&B, COVID) ARPGX2
Influenza A by PCR: NEGATIVE
Influenza B by PCR: NEGATIVE
SARS Coronavirus 2 by RT PCR: NEGATIVE

## 2022-04-18 LAB — CBG MONITORING, ED: Glucose-Capillary: 110 mg/dL — ABNORMAL HIGH (ref 70–99)

## 2022-04-18 MED ORDER — AZITHROMYCIN 250 MG PO TABS: 250.0000 mg | ORAL_TABLET | Freq: Every day | ORAL | 0 refills | Status: DC

## 2022-04-18 MED ORDER — BENZONATATE 100 MG PO CAPS
100.0000 mg | ORAL_CAPSULE | Freq: Three times a day (TID) | ORAL | 0 refills | Status: DC
Start: 2022-04-18 — End: 2022-07-23

## 2022-04-18 NOTE — ED Notes (Signed)
ED Provider at bedside. 

## 2022-04-18 NOTE — ED Provider Notes (Addendum)
[facility] EMERGENCY DEPARTMENT Provider Note   CSN: [id number] Arrival date & time: 04/18/22  1134     History  Chief Complaint  Patient presents with   Cough    Catherine Leon is a 81 y.o. female history includes CKD, diabetes, CAD, hypertension, hyperlipidemia, Raynauds disease, CHF.   Patient presented to the ER today with her husband for evaluation of cough and rhinorrhea.  Patient reports that on Monday night 04/15/2022 she developed a mild sore throat, runny nose and nonproductive cough.  Patient has been using over-the-counter cough medicine and cold medicine with some improvement of her symptoms.  She reports that her sore throat has completely resolved she has noticed some continued postnasal drip and feels this is causing her cough.  She reports that she has been coughing up white occasionally yellow-tinged sputum for the past 2 days.  Patient tried to call her primary care provider to be seen today but they did not have any appointments that showed she came here for evaluation instead.  Patient denies fever, chills, sore throat, chest pain/shortness of breath, pleurisy, hemoptysis, abdominal pain, nausea, vomiting, diarrhea, extremity swelling/color change, history of blood clot or any additional concern.     HPI     Home Medications Prior to Admission medications   Medication Sig Start Date End Date Taking? Authorizing Provider  azithromycin (ZITHROMAX) 250 MG tablet Take 1 tablet (250 mg total) by mouth daily. Take first 2 tablets together on day one, then 1 every day until finished. 04/18/22  Yes Nuala Alpha A, PA-C  benzonatate (TESSALON) 100 MG capsule Take 1 capsule (100 mg total) by mouth every 8 (eight) hours. 04/18/22  Yes Nuala Alpha A, PA-C  FARXIGA 10 MG TABS tablet Take 10 mg by mouth daily. 08/21/21  Yes [provider]  furosemide (LASIX) 40 MG tablet Take 1 tablet (40 mg total) by mouth daily. 01/30/22  Yes Jerline Pain, MD   metFORMIN (GLUCOPHAGE-XR) 750 MG 24 hr tablet Take 750 mg by mouth daily. 07/17/20  Yes [provider]  potassium chloride SA (KLOR-CON M) 20 MEQ tablet Take 1 tablet (20 mEq total) by mouth daily. 04/02/22  Yes Jerline Pain, MD  rosuvastatin (CRESTOR) 10 MG tablet TAKE 1 TABLET DAILY 02/15/22  Yes Skains, Thana Farr, MD  sacubitril-valsartan (ENTRESTO) 24-26 MG Take 1 tablet by mouth 2 (two) times daily. 04/16/22  Yes Jerline Pain, MD      Allergies    Dapagliflozin-metformin hcl er    Review of Systems   Review of Systems Ten systems are reviewed and are negative for acute change except as noted in the HPI  Physical Exam Updated Vital Signs BP 130/81   Pulse 84   Temp 98.1 F (36.7 C) (Oral)   Resp 17   Ht '5\' 5"'$  (1.651 m)   Wt 59.4 kg   SpO2 97%   BMI 21.80 kg/m  Physical Exam Constitutional:      General: She is not in acute distress.    Appearance: Normal appearance. She is well-developed. She is not ill-appearing or diaphoretic.  HENT:     Head: Normocephalic and atraumatic.     Jaw: There is normal jaw occlusion.     Right Ear: External ear normal.     Left Ear: External ear normal.     Nose: Rhinorrhea present. Rhinorrhea is clear.     Mouth/Throat:     Mouth: Mucous membranes are moist.     Pharynx: Oropharynx  is clear. Uvula midline. No pharyngeal swelling or posterior oropharyngeal erythema.  Eyes:     General: Vision grossly intact. Gaze aligned appropriately.     Conjunctiva/sclera: Conjunctivae normal.     Pupils: Pupils are equal, round, and reactive to light.  Neck:     Trachea: Trachea and phonation normal.  Cardiovascular:     Rate and Rhythm: Normal rate and regular rhythm.     Pulses: Normal pulses.          Dorsalis pedis pulses are 2+ on the right side and 2+ on the left side.  Pulmonary:     Effort: Pulmonary effort is normal. No respiratory distress.     Breath sounds: Normal breath sounds and air entry.  Abdominal:     General: There is  no distension.     Palpations: Abdomen is soft.     Tenderness: There is no abdominal tenderness. There is no guarding or rebound.  Musculoskeletal:        General: Normal range of motion.     Cervical back: Normal range of motion.     Right lower leg: No edema.     Left lower leg: No edema.  Skin:    General: Skin is warm and dry.  Neurological:     Mental Status: She is alert.     GCS: GCS eye subscore is 4. GCS verbal subscore is 5. GCS motor subscore is 6.     Comments: Speech is clear and goal oriented, follows commands Major Cranial nerves without deficit, no facial droop Moves extremities without ataxia, coordination intact  Psychiatric:        Behavior: Behavior normal.     ED Results / Procedures / Treatments   Labs (all labs ordered are listed, but only abnormal results are displayed) Labs Reviewed  CBC WITH DIFFERENTIAL/PLATELET - Abnormal; Notable for the following components:      Result Value   Hemoglobin 11.8 (*)    HCT 35.0 (*)    All other components within normal limits  COMPREHENSIVE METABOLIC PANEL - Abnormal; Notable for the following components:   Glucose, Bld 116 (*)    Creatinine, Ser 1.42 (*)    GFR, Estimated 37 (*)    All other components within normal limits  CBG MONITORING, ED - Abnormal; Notable for the following components:   Glucose-Capillary 110 (*)    All other components within normal limits  RESP PANEL BY RT-PCR (FLU A&B, COVID) [id number]    EKG EKG Interpretation  Date/Time:  Thursday April 18 2022 11:53:47 EDT Ventricular Rate:  89 PR Interval:  171 QRS Duration: 114 QT Interval:  342 QTC Calculation: 417 R Axis:   -62 Text Interpretation: Sinus rhythm Atrial premature complexes Probable left atrial enlargement Left anterior fascicular block Low voltage, precordial leads Consider anterior infarct Similar to 2018 tracing Confirmed by Nanda Quinton [id number]) on 04/18/2022 12:01:58 PM  Radiology DG Chest 2 View  Result Date:  04/18/2022 CLINICAL DATA:  Provided history: Productive cough. EXAM: CHEST - 2 VIEW COMPARISON:  Prior chest radiographs 06/19/2020. FINDINGS: Heart size within normal limits. Aortic atherosclerosis. No appreciable airspace consolidation or pulmonary edema. No evidence of pleural effusion or pneumothorax. No acute bony abnormality identified. Degenerative changes of the spine. Unchanged from the prior exam, surgical clips project in the region of the right upper thorax/right axilla. IMPRESSION: No evidence of acute cardiopulmonary abnormality. Aortic Atherosclerosis (ICD10-I70.0). Electronically Signed   By: Kellie Simmering D.O.   On: 04/18/2022 12:23  Procedures Procedures    Medications Ordered in ED Medications - No data to display  ED Course/ Medical Decision Making/ A&P Clinical Course as of 04/18/22 1350  Thu Apr 18, 2022  1158 EKG 12-Lead I have personally reviewed and interpreted patient's twelve-lead EKG.  I do not appreciate any obvious acute ischemic changes.  Normal sinus rhythm [BM]  1200 Pulse Rate(!): 38 Suspect error,  no bradycardia during evaluations. EKG nsr. PR 171. [BM]  2458 DG Chest 2 View I have personally reviewed and interpreted patient's two-view chest x-ray.  I do not appreciate any obvious PTX, PNA, pleural effusion or other acute cardiopulmonary process. [BM]  1334 CBC with Differential(!) CBC shows hemoglobin of 11.8 which is similar to prior.  No evidence for acute anemia.  No leukocytosis to suggest acute bacterial process.  No thrombocytopenia. [BM]  1334 Comprehensive metabolic panel(!) CMP shows mild elevation of creatinine at 1.42.  No emergent electrolyte derangement, LFT elevations or gap.  Suspect elevated creatinine secondary to some dehydration patient encouraged to maintain oral hydration and have creatinine rechecked by PCP on Monday. [BM]  1334 POC CBG, ED(!) CBG 110 [BM]  1334 Resp Panel by RT-PCR (Flu A&B, Covid) Anterior Nasal  Swab COVID/influenza panel negative. [BM]    Clinical Course User Index [BM] Gari Crown                           Medical Decision Making 81 year old female with history as above presented with her husband today for evaluation of cough with white sputum x2 days.  Patient has been experiencing URI symptoms with rhinorrhea/postnasal drip and sore throat for the past 3 days, sore throat has resolved however postnasal drip has persisted which patient feels is the cause of her symptoms at this time.  She denies any associated chest pain, shortness of breath she has no systemic symptoms such as fever.  No abdominal pain nausea vomiting diarrhea she reports she overall feels well but would like help with her cough.  Differential includes but not limited to viral URI, pneumonia, bronchitis.  Plan will be to obtain CBC, CMP, chest x-ray, EKG, CBG and COVID/influenza panel.  Patient has been placed on monitor.  Her vital signs are stable with SPO2 98% on room air.  Amount and/or Complexity of Data Reviewed Independent Historian: spouse    Details: Husband at bedside Labs: ordered. Decision-making details documented in ED Course. Radiology: ordered. Decision-making details documented in ED Course. ECG/medicine tests:  Decision-making details documented in ED Course.  Risk Prescription drug management. Risk Details: Patient's work-up today overall reassuring.  On reevaluation she is well-appearing and in no acute distress vital signs stable on room air and she is requesting discharge.  Suspect patient is experiencing upper respiratory tract infection with postnasal drip as cause of her cough however patient does have several comorbidities and may be developing an early bronchitis.  I discussed the case and reviewed results with Dr. Adria Devon, plan will be to provide patient with a course of azithromycin for bronchitis and have patient closely follow-up with PCP for recheck.  Patient is in agreement  with plan she has noticed complaints or concerns.  Patient has no history of shortness of breath.  Low suspicion for PE, bacterial pneumonia, acute pulmonary edema, sepsis or other emergent causes of patient's symptoms.  No work-up indicated at this time.  I discussed strict ER precautions with patient and her husband today and they state understanding.  At this time there does not appear to be any evidence of an acute emergency medical condition and the patient appears stable for discharge with appropriate outpatient follow up. Diagnosis was discussed with patient who verbalizes understanding of care plan and is agreeable to discharge. I have discussed return precautions with patient and husband who verbalizes understanding. Patient encouraged to follow-up with their PCP. All questions answered.  Patient's case discussed with Dr. Laverta Baltimore who agrees with plan to discharge with follow-up.   Note: Portions of this report may have been transcribed using voice recognition software. Every effort was made to ensure accuracy; however, inadvertent computerized transcription errors may still be present.         Final Clinical Impression(s) / ED Diagnoses Final diagnoses:  Acute upper respiratory infection  Elevated serum creatinine    Rx / DC Orders ED Discharge Orders          Ordered    azithromycin (ZITHROMAX) 250 MG tablet  Daily        04/18/22 1337    benzonatate (TESSALON) 100 MG capsule  Every 8 hours        04/18/22 1339              Deliah Boston, PA-C 04/18/22 1345    Deliah Boston, PA-C 04/18/22 1350    Margette Fast, MD 04/22/22 [phone]

## 2022-04-18 NOTE — Discharge Instructions (Addendum)
At this time there does not appear to be the presence of an emergent medical condition, however there is always the potential for conditions to change. Please read and follow the below instructions.  Please return to the Emergency Department immediately for any new or worsening symptoms or if your symptoms do not improve within 3 days. Please be sure to follow up with your Primary Care Provider within one week regarding your visit today; please call their office to schedule an appointment even if you are feeling better for a follow-up visit. Please take your antibiotic Azithromycin as prescribed until complete to help with your symptoms.  Please drink enough water to avoid dehydration and get plenty of rest. Your creatinine level was slightly elevated today.  Please drink enough of water and have your creatinine level rechecked by your primary care provider next week. You may use the cough medication Tessalon as prescribed to help with your cough.   Please read the additional information packets attached to your discharge summary.  Go to the nearest Emergency Department immediately if: You have fever or chills You have shortness of breath  You have chest pain You have very bad or constant: Headache. Ear pain. Pain in your forehead, behind your eyes, and over your cheekbones (sinus pain). You have long-lasting (chronic) lung disease along with any of these: Making high-pitched whistling sounds when you breathe, most often when you breathe out (wheezing). Long-lasting cough (more than 14 days). Coughing up blood. A change in your usual mucus. You have a stiff neck. You have changes in your: Vision. Hearing. Thinking. Mood.     You have any new/concerning or worsening of symptoms.  Do not take your medicine if  develop an itchy rash, swelling in your mouth or lips, or difficulty breathing; call 911 and seek immediate emergency medical attention if this occurs.  You may review your lab  tests and imaging results in their entirety on your MyChart account.  Please discuss all results of fully with your primary care provider and other specialist at your follow-up visit.  Note: Portions of this text may have been transcribed using voice recognition software. Every effort was made to ensure accuracy; however, inadvertent computerized transcription errors may still be present.

## 2022-04-18 NOTE — ED Triage Notes (Signed)
Pt reports productive cough since Sunday. Green/brown mucous. Denies SOB pr CP. Also reports nasal drainage and congestion

## 2022-06-18 ENCOUNTER — Other Ambulatory Visit: Payer: Self-pay | Admitting: Cardiology

## 2022-07-12 DIAGNOSIS — I10 Essential (primary) hypertension: Secondary | ICD-10-CM | POA: Diagnosis not present

## 2022-07-12 DIAGNOSIS — E119 Type 2 diabetes mellitus without complications: Secondary | ICD-10-CM | POA: Diagnosis not present

## 2022-07-12 IMAGING — MG DIGITAL SCREENING UNILAT LEFT W/ TOMO W/ CAD
6 series · 6 of 18 positions shown · non-contrast
Comparison: Previous exam(s).

CLINICAL DATA: Screening.

EXAM:
DIGITAL SCREENING UNILATERAL LEFT MAMMOGRAM WITH CAD AND TOMO

[L MLO synth-2D (1 of 2)]
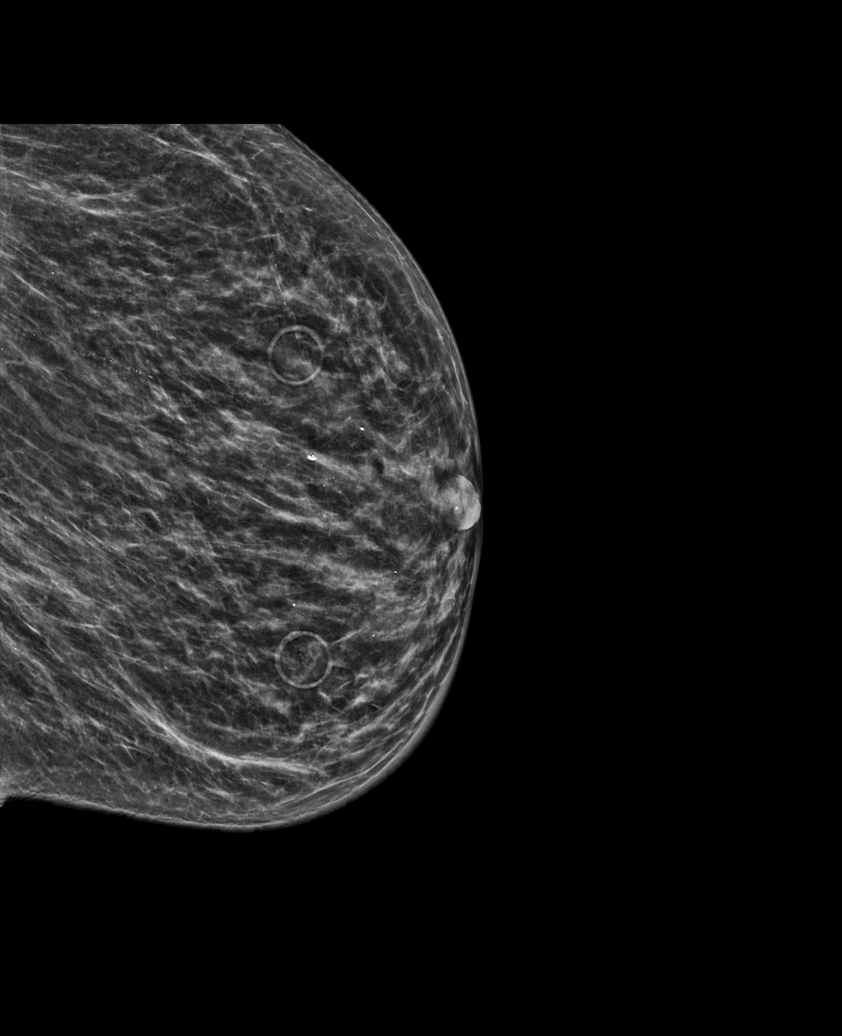

[L MLO synth-2D (2 of 2)]
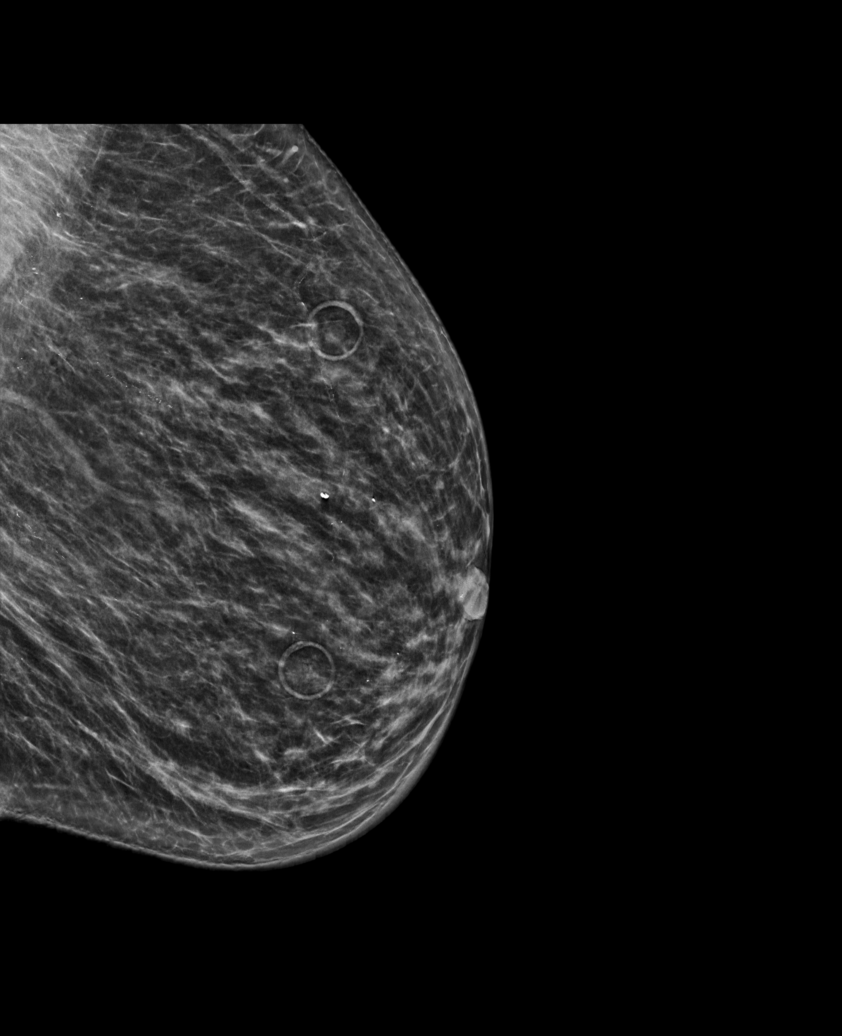

[L CC synth-2D]
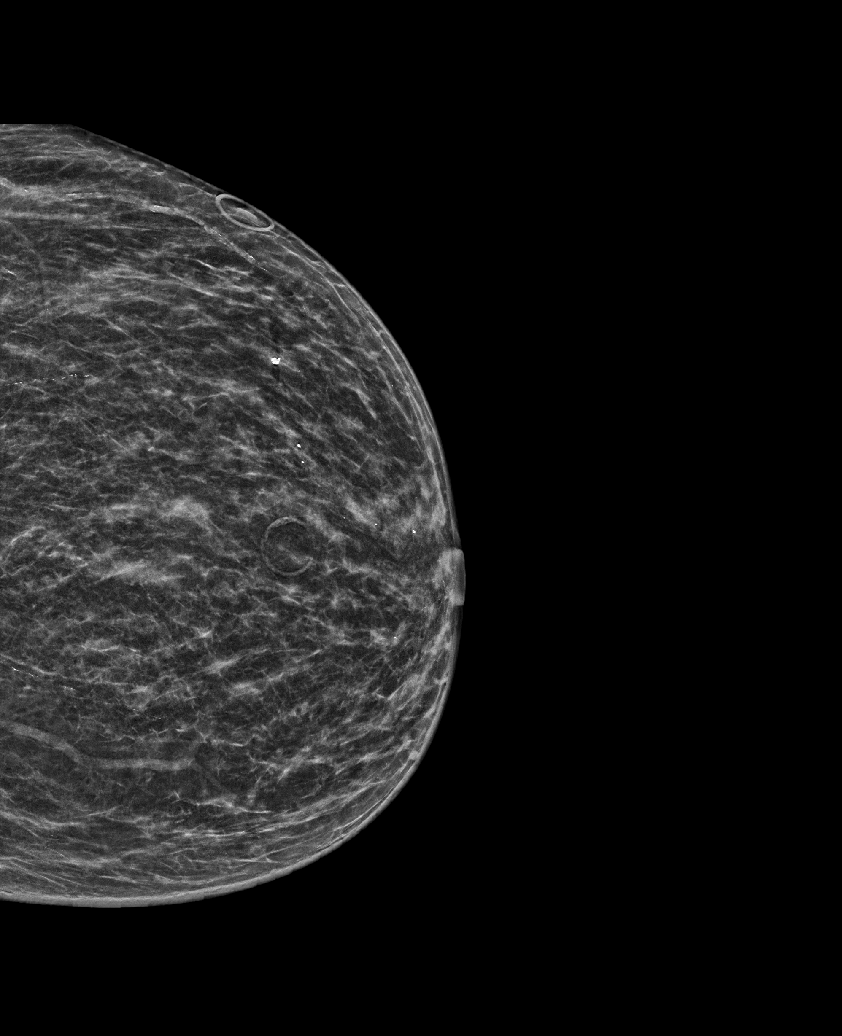

[L MLO tomo (1 of 2) · tomo slice 31/62.0]
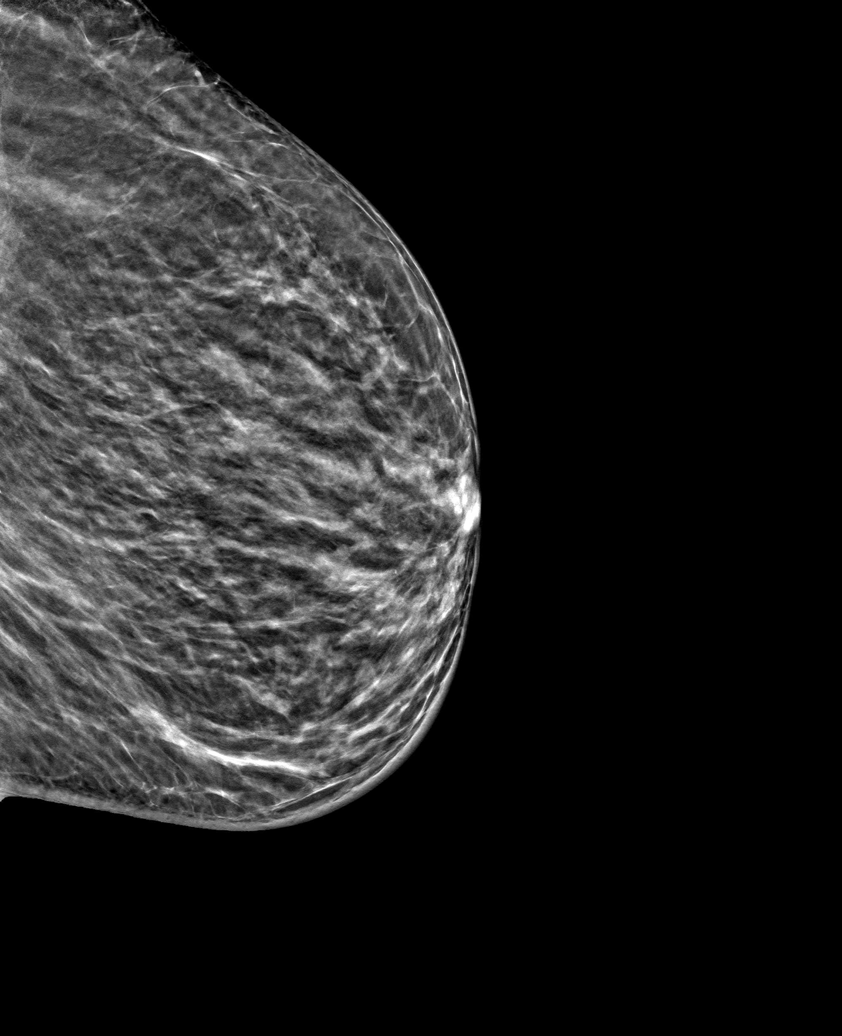

[L MLO tomo (2 of 2) · tomo slice 33/66.0]
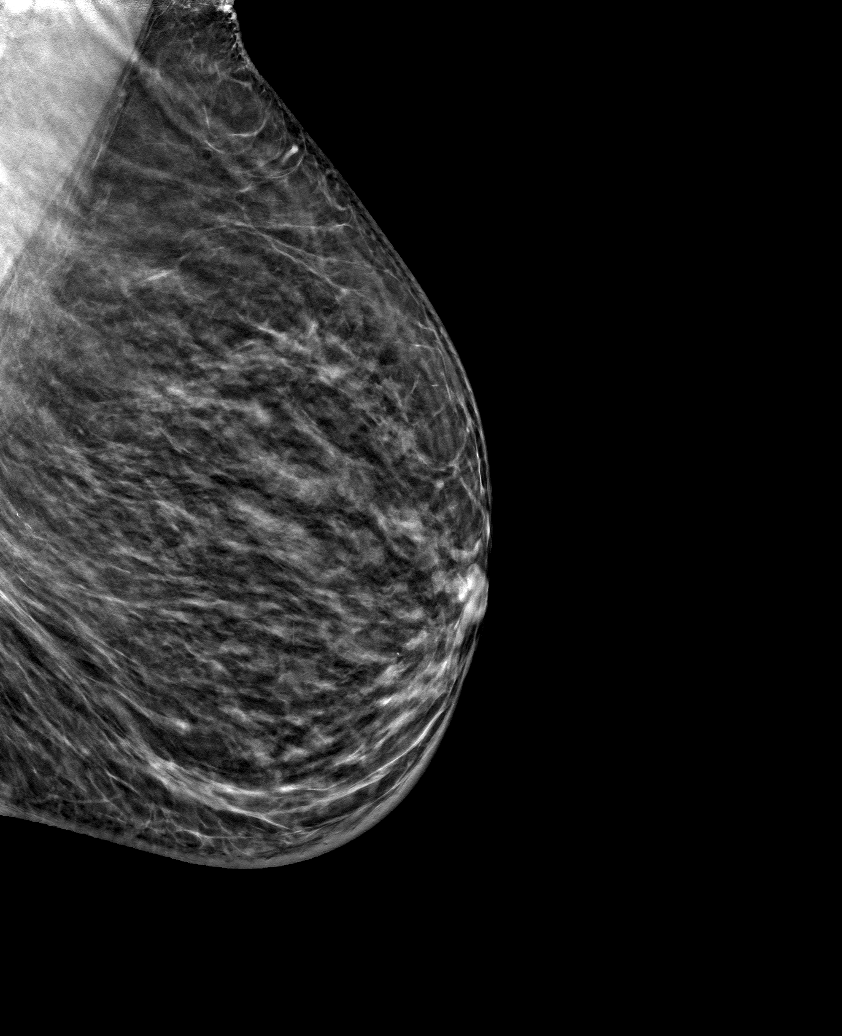

[L CC tomo · tomo slice 30/59.0]
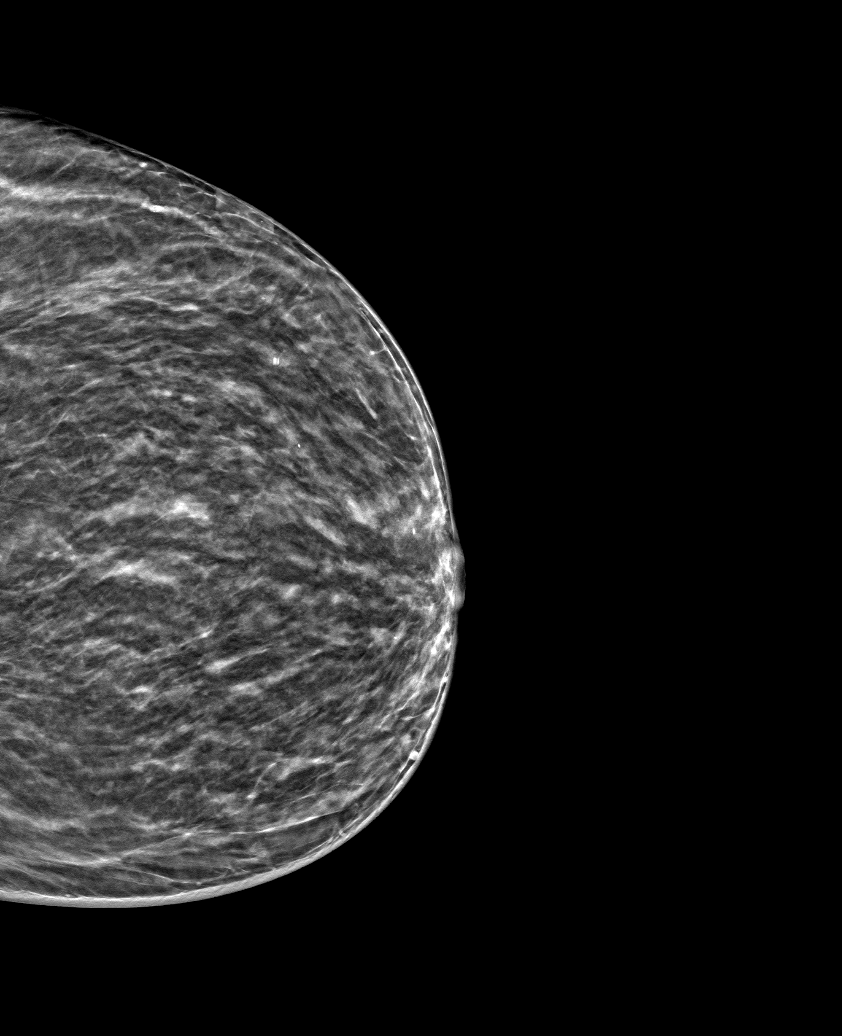

[6 of 18 positions shown; findings below may reference images not displayed]

ACR Breast Density Category b: There are scattered areas of
fibroglandular density.
FINDINGS: There are no findings suspicious for malignancy. Images were
processed with CAD.
IMPRESSION: No mammographic evidence of malignancy. A result letter of this
screening mammogram will be mailed directly to the patient.

RECOMMENDATION:
Screening mammogram in one year. (Code:[id number]-Y-CJA)

BI-RADS CATEGORY  1: Negative.

## 2022-07-17 DIAGNOSIS — E1121 Type 2 diabetes mellitus with diabetic nephropathy: Secondary | ICD-10-CM | POA: Diagnosis not present

## 2022-07-17 DIAGNOSIS — I5042 Chronic combined systolic (congestive) and diastolic (congestive) heart failure: Secondary | ICD-10-CM | POA: Diagnosis not present

## 2022-07-17 DIAGNOSIS — I700 Atherosclerosis of aorta: Secondary | ICD-10-CM | POA: Diagnosis not present

## 2022-07-17 DIAGNOSIS — I428 Other cardiomyopathies: Secondary | ICD-10-CM | POA: Diagnosis not present

## 2022-07-17 DIAGNOSIS — I10 Essential (primary) hypertension: Secondary | ICD-10-CM | POA: Diagnosis not present

## 2022-07-17 DIAGNOSIS — R42 Dizziness and giddiness: Secondary | ICD-10-CM | POA: Diagnosis not present

## 2022-07-17 DIAGNOSIS — N1832 Chronic kidney disease, stage 3b: Secondary | ICD-10-CM | POA: Diagnosis not present

## 2022-07-17 DIAGNOSIS — Z23 Encounter for immunization: Secondary | ICD-10-CM | POA: Diagnosis not present

## 2022-07-17 DIAGNOSIS — Z0000 Encounter for general adult medical examination without abnormal findings: Secondary | ICD-10-CM | POA: Diagnosis not present

## 2022-07-23 ENCOUNTER — Ambulatory Visit: Payer: Medicare Other | Attending: Cardiology | Admitting: Cardiology

## 2022-07-23 ENCOUNTER — Encounter: Payer: Self-pay | Admitting: Cardiology

## 2022-07-23 VITALS — BP 110/60 | HR 72 | Ht 65.0 in | Wt 126.0 lb

## 2022-07-23 DIAGNOSIS — I5042 Chronic combined systolic (congestive) and diastolic (congestive) heart failure: Secondary | ICD-10-CM | POA: Insufficient documentation

## 2022-07-23 DIAGNOSIS — I2510 Atherosclerotic heart disease of native coronary artery without angina pectoris: Secondary | ICD-10-CM | POA: Diagnosis not present

## 2022-07-23 DIAGNOSIS — I428 Other cardiomyopathies: Secondary | ICD-10-CM | POA: Insufficient documentation

## 2022-07-23 NOTE — Patient Instructions (Signed)
Medication Instructions:  The current medical regimen is effective;  continue present plan and medications.  *If you need a refill on your cardiac medications before your next appointment, please call your pharmacy*  Follow-Up: At [facility], you and your health needs are our priority.  As part of our continuing mission to provide you with exceptional heart care, we have created designated Provider Care Teams.  These Care Teams include your primary Cardiologist (physician) and Advanced Practice Providers (APPs -  Physician Assistants and Nurse Practitioners) who all work together to provide you with the care you need, when you need it.  We recommend signing up for the patient portal called "MyChart".  Sign up information is provided on this After Visit Summary.  MyChart is used to connect with patients for Virtual Visits (Telemedicine).  Patients are able to view lab/test results, encounter notes, upcoming appointments, etc.  Non-urgent messages can be sent to your provider as well.   To learn more about what you can do with MyChart, go to [email or web address].    Your next appointment:   1 year(s)  The format for your next appointment:   In Person  Provider:   Mark Skains, MD      Important Information About Sugar       

## 2022-07-23 NOTE — Progress Notes (Signed)
Cardiology Office Note:    Date:  07/23/2022   ID:  Catherine Leon, DOB [birth year], MRN [id number]  PCP:  Deland Pretty, [place]  Cardiologist:  Candee Furbish, MD  Advanced Practice Provider:  No care team member to display Electrophysiologist:  None       Referring MD: Deland Pretty, MD     History of Present Illness:    Catherine Leon is a 81 y.o. female here for the follow-up of chronic systolic heart failure.  She called the office 12/12/2021 reporting low blood pressures ranging in the 80's/50's to the 100's/90's, with some associated lightheadedness. Previously she did feel better since stopping Toprol and spironolactone. Her Entresto was decreased to 24/26 mg twice daily.  Previously here for the evaluation of worsening edema shortness of breath. Increased Lasix from 20 mg once a day to 40 mg twice a day for 4 days back in April 7.  She felt better with the increase in furosemide having much less shortness of breath. Gained 10 pounds.  Did well with the increase in Lasix.  She also takes metformin which is a large pill she states.  Potassium is hard to swallow as well.  Her creatinine on 12/19/2020 was 0.95 with potassium of 3.4.  Recent LDL 49  She has nonischemic cardiomyopathy, EF had improved from 42 to 50%.  Had occasional shortness of breath with activity NYHA class II-like symptoms.    During her last visit, she was accompanied by a family member. She brought her BP machine. Her most recent blood pressures at the time were better, and she noted having no reactions to her BP or her medications. She was feeling better since decreasing her Entresto.  She was taking 40 mg Lasix twice daily. Usually she takes it at night. She would take potassium as well.  Regarding her activity she had gone on walks and felt well with no dyspnea.  Today, she is overall well. She complains of occasional shortness of breath. She reports that she hasn't ever felt  any chest pain, and that it was just the shortness of breath.   She reported that her Delene Loll was reduced to 24-26 mg due to her high blood pressure.   She brought a copy of her lab workup which reported her creatinine is at 1.28, A1C is 6.8, hemoglobin is 12, and LDL is 72.   She denies any palpitations, chest pain, or peripheral edema. No lightheadedness, headaches, syncope, orthopnea, or PND.   Past Medical History:  Diagnosis Date   Anemia    BMI 27.0-27.9,adult    Breast cancer (HCC)    Chronic kidney disease, stage 3a ([place])    Constipation    Contact lens/glasses fitting    wears contacts or glasses   Diabetes mellitus    Family history of early CAD    Goiter    Hx of breast cancer 05/04/2012   Hx of radiation therapy    Hyperlipidemia    Hypertension    Mild CAD    Mild mitral regurgitation    Osteoarthritis    Palpitations    Personal history of chemotherapy 2001   Personal history of radiation therapy 2001   Post-menopausal    Raynaud's disease    Sickle cell anemia (HCC)    Urinary frequency    Wears partial dentures    partial top   Weight loss     Past Surgical History:  Procedure Laterality Date  BREAST LUMPECTOMY Right 2001   BREAST SURGERY  07/16/2000   rt lump-axillary dissectopn   CHOLECYSTECTOMY  1995 - approximate   COLONOSCOPY     LESION EXCISION WITH COMPLEX REPAIR Left 10/22/2016   Procedure: COMPLEX REPAIR LEFT CHEST 10 CM;  Surgeon: Irene Limbo, MD;  Location: [facility];  Service: Plastics;  Laterality: Left;   MASS EXCISION Right 06/15/2013   Procedure: EXCISION right neck MASS;  Surgeon: Adin Hector, MD;  Location: [facility];  Service: General;  Laterality: Right;   MASTECTOMY  12/11/2009   RIGHT BREAST   TONSILLECTOMY      Current Medications: Current Meds  Medication Sig   FARXIGA 10 MG TABS tablet Take 10 mg by mouth daily.   furosemide (LASIX) 40 MG tablet Take 1 tablet (40 mg total) by  mouth daily.   metFORMIN (GLUCOPHAGE-XR) 750 MG 24 hr tablet Take 750 mg by mouth daily.   potassium chloride SA (KLOR-CON M) 20 MEQ tablet Take 1 tablet (20 mEq total) by mouth daily.   rosuvastatin (CRESTOR) 10 MG tablet TAKE 1 TABLET DAILY   sacubitril-valsartan (ENTRESTO) 24-26 MG Take 1 tablet by mouth 2 (two) times daily.     Allergies:   Dapagliflozin pro-metformin er   Social History   Socioeconomic History   Marital status: Married    Spouse name: Not on file   Number of children: Not on file   Years of education: Not on file   Highest education level: Not on file  Occupational History   Not on file  Tobacco Use   Smoking status: Former    Types: Cigarettes    Quit date: 11/04/1982    Years since quitting: 39.7   Smokeless tobacco: Never  Substance and Sexual Activity   Alcohol use: Yes    Comment: WINE. AVERAGE 1 GLASS A MONTH   Drug use: No   Sexual activity: Not on file  Other Topics Concern   Not on file  Social History Narrative   Not on file   Social Determinants of Health   Financial Resource Strain: Not on file  Food Insecurity: Not on file  Transportation Needs: Not on file  Physical Activity: Not on file  Stress: Not on file  Social Connections: Not on file     Family History: The patient's family history includes Breast cancer in her paternal aunt; Cancer in her paternal aunt; Diabetes in her father; Heart disease in her mother; Kidney disease in her father.  ROS:   Please see the history of present illness.    (+) Shortness of breath All other systems reviewed and are negative.  EKGs/Labs/Other Studies Reviewed:    The following studies were reviewed today:  ECHO 10/17/20:   1. Mitral b-bump in m-mode, suggestive of elevated LVEDP. LV Apical false  tendon. Left ventricular ejection fraction, by estimation, is 45 to 50%.  The left ventricle has mildly decreased function. The left ventricle  demonstrates global hypokinesis. Left    ventricular diastolic parameters are consistent with Grade II diastolic  dysfunction (pseudonormalization). Elevated left ventricular end-diastolic  pressure. The E/e' is 25. The average left ventricular global longitudinal  strain is -11.3 %. The global  longitudinal strain is abnormal.   2. Right ventricular systolic function is mildly reduced. The right  ventricular size is normal.   3. Left atrial size was moderately dilated.   4. Right atrial size was mild to moderately dilated.   5. The mitral valve is  abnormal. Mild mitral valve regurgitation.   6. The aortic valve is tricuspid. Aortic valve regurgitation is trivial.  Mild aortic valve sclerosis is present, with no evidence of aortic valve  stenosis.   Comparison(s): Changes from prior study are noted. 07/27/2020: LVEF 42%.   A CT scan of her coronary arteries was performed on 08/11/2020: IMPRESSION: 1. Coronary calcium score of 26. This was 77 percentile for age and sex matched control.   2. Normal coronary origin with left dominance.   3. CAD-RADS 2. Mild non-obstructive CAD (25-49%). Consider non-atherosclerotic causes of chest pain. Consider preventive therapy and risk factor modification.    EKG:  EKG is personally reviewed and interpreted.  07/23/2022: EKG was not ordered. 04/22/2022: Sinus rhythm Atrial premature complexes Probable left atrial enlargement Left anterior fascicular block Low voltage, precordial leads Consider anterior infarct Similar to 2018 tracing   01/30/2022: EKG was not ordered. 08/27/2021 Christen Bame, NP): NSR with occ PACs, NSIVCD, no ST/T wave abnormality  Recent Labs: 04/18/2022: ALT 14; BUN 16; Creatinine, Ser 1.42; Hemoglobin 11.8; Platelets 265; Potassium 3.6; Sodium 142   Recent Lipid Panel    Component Value Date/Time   CHOL 116 11/16/2020 0904   TRIG 56 11/16/2020 0904   HDL 54 11/16/2020 0904   CHOLHDL 2.1 11/16/2020 0904   LDLCALC 49 11/16/2020 0904     Risk  Assessment/Calculations:      Physical Exam:    VS:  BP 110/60 (BP Location: Left Arm, Patient Position: Sitting, Cuff Size: Normal)   Pulse 72   Ht '5\' 5"'$  (1.651 m)   Wt 126 lb (57.2 kg)   BMI 20.97 kg/m     Wt Readings from Last 3 Encounters:  07/23/22 126 lb (57.2 kg)  04/18/22 131 lb (59.4 kg)  01/30/22 131 lb (59.4 kg)     GEN:  Well nourished, well developed in no acute distress HEENT: Normal NECK: No JVD; No carotid bruits LYMPHATICS: No lymphadenopathy CARDIAC: RRR, no murmurs, rubs, gallops RESPIRATORY:  Clear to auscultation without rales, wheezing or rhonchi  ABDOMEN: Soft, non-tender, non-distended MUSCULOSKELETAL:  Trace LE edema; No deformity  SKIN: Warm and dry NEUROLOGIC:  Alert and oriented x 3 PSYCHIATRIC:  Normal affect   ASSESSMENT:    1. Nonischemic cardiomyopathy ([place])   2. Chronic combined systolic and diastolic CHF (congestive heart failure) ([place])   3. Mild CAD      PLAN:    In order of problems listed above:  Nonischemic cardiomyopathy ([place]) Continue with Entresto at low-dose 24/26.  She now has capability of getting the medication.  It was expensive.  She was on a plan currently.  She is also on [place].  We will decrease Lasix to 40 mg once a day.  Overall seems to be doing quite well well compensated.  NYHA class I.   Her blood pressures have improved.  At 1 point they were in the 78/57-80 range.  After cutting back the [facility] she is better.   She really feels like she got the most benefit from the [place] previously.  We will try to keep this going.   Diabetes mellitus with coincident hypertension ([place]) Improved control.  After she came back from [place], [place] with her daughter, her hemoglobin A1c was in the 7.5 range.  It is decreasing.  Continue with Wilder Glade as well as metformin.  Excellent.  Doing well.     Follow-up in 1 year   Medication Adjustments/Labs and Tests Ordered: Current medicines are reviewed at length with  the  patient today.  Concerns regarding medicines are outlined above.   No orders of the defined types were placed in this encounter.  No orders of the defined types were placed in this encounter.  Patient Instructions  Medication Instructions:  The current medical regimen is effective;  continue present plan and medications.  *If you need a refill on your cardiac medications before your next appointment, please call your pharmacy*  Follow-Up: At [facility], you and your health needs are our priority.  As part of our continuing mission to provide you with exceptional heart care, we have created designated Provider Care Teams.  These Care Teams include your primary Cardiologist (physician) and Advanced Practice Providers (APPs -  Physician Assistants and Nurse Practitioners) who all work together to provide you with the care you need, when you need it.  We recommend signing up for the patient portal called "MyChart".  Sign up information is provided on this After Visit Summary.  MyChart is used to connect with patients for Virtual Visits (Telemedicine).  Patients are able to view lab/test results, encounter notes, upcoming appointments, etc.  Non-urgent messages can be sent to your provider as well.   To learn more about what you can do with MyChart, go to [email or web address].    Your next appointment:   1 year(s)  The format for your next appointment:   In Person  Provider:   Candee Furbish, MD      Important Information About Sugar          I,Rachel Rivera,acting as a scribe for Candee Furbish, MD.,have documented all relevant documentation on the behalf of Candee Furbish, MD,as directed by  Candee Furbish, MD while in the presence of Candee Furbish, MD.  I, Candee Furbish, MD, have reviewed all documentation for this visit. The documentation on 07/23/22 for the exam, diagnosis, procedures, and orders are all accurate and complete.   Signed, Candee Furbish, MD  07/23/2022 1:33 PM     [facility]

## 2022-07-24 DIAGNOSIS — Z23 Encounter for immunization: Secondary | ICD-10-CM | POA: Diagnosis not present

## 2022-07-31 DIAGNOSIS — Z23 Encounter for immunization: Secondary | ICD-10-CM | POA: Diagnosis not present

## 2022-08-05 ENCOUNTER — Telehealth: Payer: Self-pay | Admitting: Cardiology

## 2022-08-05 NOTE — Telephone Encounter (Signed)
New Message:     Please call, concening application for her Entresto.

## 2022-08-05 NOTE — Telephone Encounter (Signed)
**Note De-Identified Etan Vasudevan Obfuscation** The pt is advised that she can drop her completed NPAF application for Entresto assistance off in the front office at Dr Marlou Porch office and that we will take care of the providers page, have Dr Marlou Porch sign/date it, and that we will then fax all to NPAF.  She thanked me for calling her back.

## 2022-08-14 NOTE — Telephone Encounter (Signed)
**Note De-Identified Talonda Artist Obfuscation** The pts NPAF application for Entresto assistance was left at the office with documents.  I have completed the providers page of the application and have e-mailed all to the nurse working with Dr Marlou Porch tomorrow so she can obtain his signature, date it, and to then fax all to NPAF at the fax number written on the cover letter included.

## 2022-08-15 NOTE — Telephone Encounter (Signed)
Application printed, signed, faxed and placed for scan.

## 2022-08-21 ENCOUNTER — Other Ambulatory Visit: Payer: Self-pay | Admitting: Internal Medicine

## 2022-08-21 DIAGNOSIS — Z1231 Encounter for screening mammogram for malignant neoplasm of breast: Secondary | ICD-10-CM

## 2022-10-16 ENCOUNTER — Ambulatory Visit
Admission: RE | Admit: 2022-10-16 | Discharge: 2022-10-16 | Disposition: A | Discharge status: Home / Self Care | Payer: Medicare Other | Source: Ambulatory Visit | Attending: Internal Medicine | Admitting: Internal Medicine

## 2022-10-16 DIAGNOSIS — Z1231 Encounter for screening mammogram for malignant neoplasm of breast: Secondary | ICD-10-CM

## 2022-10-16 DIAGNOSIS — I10 Essential (primary) hypertension: Secondary | ICD-10-CM | POA: Diagnosis not present

## 2022-10-17 DIAGNOSIS — N1832 Chronic kidney disease, stage 3b: Secondary | ICD-10-CM | POA: Diagnosis not present

## 2022-10-17 DIAGNOSIS — E7800 Pure hypercholesterolemia, unspecified: Secondary | ICD-10-CM | POA: Diagnosis not present

## 2022-10-17 DIAGNOSIS — I5022 Chronic systolic (congestive) heart failure: Secondary | ICD-10-CM | POA: Diagnosis not present

## 2022-10-17 DIAGNOSIS — E1121 Type 2 diabetes mellitus with diabetic nephropathy: Secondary | ICD-10-CM | POA: Diagnosis not present

## 2022-10-17 DIAGNOSIS — I10 Essential (primary) hypertension: Secondary | ICD-10-CM | POA: Diagnosis not present

## 2023-01-16 DIAGNOSIS — E119 Type 2 diabetes mellitus without complications: Secondary | ICD-10-CM | POA: Diagnosis not present

## 2023-02-18 DIAGNOSIS — I10 Essential (primary) hypertension: Secondary | ICD-10-CM | POA: Diagnosis not present

## 2023-02-18 DIAGNOSIS — I5022 Chronic systolic (congestive) heart failure: Secondary | ICD-10-CM | POA: Diagnosis not present

## 2023-02-18 DIAGNOSIS — E7800 Pure hypercholesterolemia, unspecified: Secondary | ICD-10-CM | POA: Diagnosis not present

## 2023-02-18 DIAGNOSIS — N1832 Chronic kidney disease, stage 3b: Secondary | ICD-10-CM | POA: Diagnosis not present

## 2023-02-18 DIAGNOSIS — E1121 Type 2 diabetes mellitus with diabetic nephropathy: Secondary | ICD-10-CM | POA: Diagnosis not present

## 2023-03-05 ENCOUNTER — Other Ambulatory Visit: Payer: Self-pay | Admitting: Cardiology

## 2023-03-08 ENCOUNTER — Other Ambulatory Visit: Payer: Self-pay | Admitting: Cardiology

## 2023-05-12 ENCOUNTER — Other Ambulatory Visit: Payer: Self-pay | Admitting: *Deleted

## 2023-05-12 MED ORDER — POTASSIUM CHLORIDE CRYS ER 20 MEQ PO TBCR: 20.0000 meq | EXTENDED_RELEASE_TABLET | Freq: Every day | ORAL | 0 refills | Status: DC

## 2023-07-10 ENCOUNTER — Encounter: Payer: Self-pay | Admitting: Cardiology

## 2023-07-10 ENCOUNTER — Ambulatory Visit: Payer: Medicare Other | Attending: Cardiology | Admitting: Cardiology

## 2023-07-10 VITALS — BP 112/60 | HR 76 | Ht 65.0 in | Wt 132.4 lb

## 2023-07-10 DIAGNOSIS — I2510 Atherosclerotic heart disease of native coronary artery without angina pectoris: Secondary | ICD-10-CM | POA: Insufficient documentation

## 2023-07-10 DIAGNOSIS — E119 Type 2 diabetes mellitus without complications: Secondary | ICD-10-CM | POA: Diagnosis not present

## 2023-07-10 DIAGNOSIS — N1831 Chronic kidney disease, stage 3a: Secondary | ICD-10-CM | POA: Diagnosis not present

## 2023-07-10 DIAGNOSIS — I428 Other cardiomyopathies: Secondary | ICD-10-CM | POA: Diagnosis not present

## 2023-07-10 DIAGNOSIS — I10 Essential (primary) hypertension: Secondary | ICD-10-CM | POA: Diagnosis not present

## 2023-07-10 DIAGNOSIS — I5042 Chronic combined systolic (congestive) and diastolic (congestive) heart failure: Secondary | ICD-10-CM | POA: Diagnosis not present

## 2023-07-10 NOTE — Patient Instructions (Signed)

## 2023-07-10 NOTE — Progress Notes (Signed)
Cardiology Office Note:  .   Date:  07/10/2023  ID:  Catherine Leon, DOB 1941/07/22, MRN [id number] PCP: Merri Brunette, MD  [facility] Providers Cardiologist:  Donato Schultz, MD     History of Present Illness: Catherine Leon   Catherine Leon is a 82 y.o. female Discussed with the use of AI scribe   History of Present Illness   The patient, an 82 year old with chronic systolic heart failure (EF 40-50%) and NYHA class II symptoms, is on a regimen of Entresto 24/26, Lasix 40mg , and 20 milliequivalents of potassium daily, along with Farxiga 10mg . She also takes Crestor 10mg  for hyperlipidemia. The most recent echocardiogram in 2022 showed an EF of 45-50%. Her Hemoglobin A1c is 6.7, LDL is 49, and creatinine is 1.4.  The patient reported an episode of feeling faint while cleaning the stove top. She described it as a sudden onset and did not attribute it to any particular activity. She checked her blood pressure and blood sugar during the episode, which were 132/70 and 148 respectively. The patient did not report any other symptoms during this episode and it resolved after approximately 10 minutes. She has not experienced a similar episode since.  The patient also mentioned that she had to stop taking Metformin due to unsteadiness on her feet, which improved after discontinuation of the medication. She is currently receiving her medications through a drug assistance program.          ROS: No CP, no SOB  Studies Reviewed: Catherine Leon   EKG Interpretation Date/Time:  Thursday July 10 2023 11:48:59 EDT Ventricular Rate:  76 PR Interval:  184 QRS Duration:  110 QT Interval:  396 QTC Calculation: 445 R Axis:   -67  Text Interpretation: Normal sinus rhythm Left axis deviation Pulmonary disease pattern Incomplete left bundle branch block Nonspecific T wave abnormality When compared with ECG of 18-Apr-2022 11:53, No significant change since last tracing Confirmed by Donato Schultz ([id number]) on 07/10/2023  12:01:05 PM    Results LABS HbA1c: 6.7% LDL: 49 mg/dL Creatinine: 1.4 mg/dL Blood Glucose: 332 mg/dL  DIAGNOSTIC Echocardiogram: EF 45-50% (2022)  Risk Assessment/Calculations:            Physical Exam:   VS:  BP 112/60   Pulse 76   Ht 5\' 5"  (1.651 m)   Wt 132 lb 6.4 oz (60.1 kg)   SpO2 99%   BMI 22.03 kg/m    Wt Readings from Last 3 Encounters:  07/10/23 132 lb 6.4 oz (60.1 kg)  07/23/22 126 lb (57.2 kg)  04/18/22 131 lb (59.4 kg)    GEN: Well nourished, well developed in no acute distress NECK: No JVD; No carotid bruits CARDIAC: RRR, no murmurs, no rubs, no gallops RESPIRATORY:  Clear to auscultation without rales, wheezing or rhonchi  ABDOMEN: Soft, non-tender, non-distended EXTREMITIES:  No edema; No deformity   ASSESSMENT AND PLAN: .    Assessment and Plan    Chronic Systolic Heart Failure NYHA class II symptoms, EF 45-50% on last echocardiogram in 2022. Currently on Entresto 24/26mg , Lasix 40mg  with of potassium, and Farxiga 10mg  daily. Reported one episode of feeling faint while cleaning, but blood pressure and blood sugar were normal at the time. -Continue current medications. -Advise patient to hydrate and rest if feeling faint.  Hyperlipidemia On Crestor 10mg  daily, LDL 49. -Continue Crestor 10mg  daily.  Diabetes Hemoglobin A1c 6.7, on Farxiga 10mg  daily. -Continue Farxiga 10mg  daily.  [id number] Sherryll Burger for prevention  Medication Assistance Patient receiving  medications through assistance programs, needs to renew forms for [facility]. -Assist patient with renewal of medication assistance forms.  Follow-up in 1 year.            Signed, Donato Schultz, MD

## 2023-08-06 ENCOUNTER — Other Ambulatory Visit: Payer: Self-pay | Admitting: Cardiology

## 2023-08-11 ENCOUNTER — Other Ambulatory Visit: Payer: Self-pay | Admitting: Cardiology

## 2023-09-04 ENCOUNTER — Other Ambulatory Visit: Payer: Self-pay | Admitting: Cardiology

## 2023-09-04 DIAGNOSIS — I10 Essential (primary) hypertension: Secondary | ICD-10-CM | POA: Diagnosis not present

## 2023-09-04 DIAGNOSIS — E119 Type 2 diabetes mellitus without complications: Secondary | ICD-10-CM | POA: Diagnosis not present

## 2023-09-08 ENCOUNTER — Telehealth: Payer: Self-pay | Admitting: Cardiology

## 2023-09-08 MED ORDER — ENTRESTO 24-26 MG PO TABS: 1.0000 | ORAL_TABLET | Freq: Two times a day (BID) | ORAL | 3 refills | Status: DC

## 2023-09-08 NOTE — Telephone Encounter (Signed)
Prescription sent to preferred pharmacy

## 2023-09-08 NOTE — Telephone Encounter (Signed)
Refill sent to preferred pharmacy. 

## 2023-09-08 NOTE — Telephone Encounter (Signed)
*  STAT* If patient is at the pharmacy, call can be transferred to refill team.   1. Which medications need to be refilled? (please list name of each medication and dose if known)   sacubitril-valsartan (ENTRESTO) 24-26 MG   2. Would you like to learn more about the convenience, safety, & potential cost savings by using the [facility]?   3. Are you open to using the [facility] (Type [facility]. ).  4. Which pharmacy/location (including street and city if local pharmacy) is medication to be sent to?  [facility]  5. Do they need a 30 day or 90 day supply?   90 day  Patient stated she has a week's worth of this medication left.  Patient noted she gets her medication from [facility].

## 2023-09-11 DIAGNOSIS — Z23 Encounter for immunization: Secondary | ICD-10-CM | POA: Diagnosis not present

## 2023-09-11 DIAGNOSIS — E1121 Type 2 diabetes mellitus with diabetic nephropathy: Secondary | ICD-10-CM | POA: Diagnosis not present

## 2023-09-11 DIAGNOSIS — R748 Abnormal levels of other serum enzymes: Secondary | ICD-10-CM | POA: Diagnosis not present

## 2023-09-11 DIAGNOSIS — I700 Atherosclerosis of aorta: Secondary | ICD-10-CM | POA: Diagnosis not present

## 2023-09-11 DIAGNOSIS — I5022 Chronic systolic (congestive) heart failure: Secondary | ICD-10-CM | POA: Diagnosis not present

## 2023-09-11 DIAGNOSIS — M720 Palmar fascial fibromatosis [Dupuytren]: Secondary | ICD-10-CM | POA: Diagnosis not present

## 2023-09-11 DIAGNOSIS — Z0000 Encounter for general adult medical examination without abnormal findings: Secondary | ICD-10-CM | POA: Diagnosis not present

## 2023-09-11 DIAGNOSIS — I10 Essential (primary) hypertension: Secondary | ICD-10-CM | POA: Diagnosis not present

## 2023-09-11 DIAGNOSIS — N1831 Chronic kidney disease, stage 3a: Secondary | ICD-10-CM | POA: Diagnosis not present

## 2023-09-12 LAB — LAB REPORT - SCANNED
A1c: 6.8
Albumin, Urine POC: 93.7
Creatinine, POC: 107.3 mg/dL
EGFR: 42
Microalb Creat Ratio: 87

## 2023-10-21 DIAGNOSIS — I10 Essential (primary) hypertension: Secondary | ICD-10-CM | POA: Diagnosis not present

## 2023-10-21 DIAGNOSIS — E7800 Pure hypercholesterolemia, unspecified: Secondary | ICD-10-CM | POA: Diagnosis not present

## 2023-10-21 DIAGNOSIS — N1832 Chronic kidney disease, stage 3b: Secondary | ICD-10-CM | POA: Diagnosis not present

## 2023-10-21 DIAGNOSIS — I5022 Chronic systolic (congestive) heart failure: Secondary | ICD-10-CM | POA: Diagnosis not present

## 2023-10-21 DIAGNOSIS — E1121 Type 2 diabetes mellitus with diabetic nephropathy: Secondary | ICD-10-CM | POA: Diagnosis not present

## 2023-11-18 ENCOUNTER — Other Ambulatory Visit: Payer: Self-pay | Admitting: Internal Medicine

## 2023-11-18 DIAGNOSIS — Z1231 Encounter for screening mammogram for malignant neoplasm of breast: Secondary | ICD-10-CM

## 2023-11-27 DIAGNOSIS — M65341 Trigger finger, right ring finger: Secondary | ICD-10-CM | POA: Diagnosis not present

## 2023-11-27 DIAGNOSIS — G5601 Carpal tunnel syndrome, right upper limb: Secondary | ICD-10-CM | POA: Diagnosis not present

## 2023-11-27 DIAGNOSIS — G5603 Carpal tunnel syndrome, bilateral upper limbs: Secondary | ICD-10-CM | POA: Diagnosis not present

## 2023-12-09 ENCOUNTER — Ambulatory Visit
Admission: RE | Admit: 2023-12-09 | Discharge: 2023-12-09 | Disposition: A | Discharge status: Home / Self Care | Source: Ambulatory Visit | Attending: Internal Medicine | Admitting: Internal Medicine

## 2023-12-09 DIAGNOSIS — Z1231 Encounter for screening mammogram for malignant neoplasm of breast: Secondary | ICD-10-CM | POA: Diagnosis not present

## 2024-03-24 DIAGNOSIS — E1121 Type 2 diabetes mellitus with diabetic nephropathy: Secondary | ICD-10-CM | POA: Diagnosis not present

## 2024-03-24 DIAGNOSIS — I5022 Chronic systolic (congestive) heart failure: Secondary | ICD-10-CM | POA: Diagnosis not present

## 2024-03-24 DIAGNOSIS — E7800 Pure hypercholesterolemia, unspecified: Secondary | ICD-10-CM | POA: Diagnosis not present

## 2024-03-24 DIAGNOSIS — I10 Essential (primary) hypertension: Secondary | ICD-10-CM | POA: Diagnosis not present

## 2024-03-24 DIAGNOSIS — N1832 Chronic kidney disease, stage 3b: Secondary | ICD-10-CM | POA: Diagnosis not present

## 2024-04-05 DIAGNOSIS — E119 Type 2 diabetes mellitus without complications: Secondary | ICD-10-CM | POA: Diagnosis not present

## 2024-05-06 ENCOUNTER — Other Ambulatory Visit: Payer: Self-pay | Admitting: Cardiology

## 2024-05-17 ENCOUNTER — Other Ambulatory Visit: Payer: Self-pay | Admitting: Cardiology

## 2024-06-08 DIAGNOSIS — E113291 Type 2 diabetes mellitus with mild nonproliferative diabetic retinopathy without macular edema, right eye: Secondary | ICD-10-CM | POA: Diagnosis not present

## 2024-07-29 ENCOUNTER — Ambulatory Visit (INDEPENDENT_AMBULATORY_CARE_PROVIDER_SITE_OTHER): Admitting: Cardiology

## 2024-07-29 ENCOUNTER — Other Ambulatory Visit (HOSPITAL_COMMUNITY): Payer: Self-pay

## 2024-07-29 ENCOUNTER — Other Ambulatory Visit: Payer: Self-pay | Admitting: Cardiology

## 2024-07-29 VITALS — BP 126/70 | HR 79 | Ht 64.5 in | Wt 129.8 lb

## 2024-07-29 DIAGNOSIS — I2510 Atherosclerotic heart disease of native coronary artery without angina pectoris: Secondary | ICD-10-CM | POA: Diagnosis not present

## 2024-07-29 DIAGNOSIS — I5042 Chronic combined systolic (congestive) and diastolic (congestive) heart failure: Secondary | ICD-10-CM

## 2024-07-29 DIAGNOSIS — E119 Type 2 diabetes mellitus without complications: Secondary | ICD-10-CM

## 2024-07-29 DIAGNOSIS — I428 Other cardiomyopathies: Secondary | ICD-10-CM

## 2024-07-29 DIAGNOSIS — I10 Essential (primary) hypertension: Secondary | ICD-10-CM

## 2024-07-29 DIAGNOSIS — N1831 Chronic kidney disease, stage 3a: Secondary | ICD-10-CM

## 2024-07-29 MED ORDER — SACUBITRIL-VALSARTAN 24-26 MG PO TABS: 1.0000 | ORAL_TABLET | Freq: Two times a day (BID) | ORAL | 3 refills | Status: DC

## 2024-07-29 MED ORDER — FUROSEMIDE 20 MG PO TABS: 20.0000 mg | ORAL_TABLET | Freq: Every day | ORAL | 3 refills | Status: AC

## 2024-07-29 NOTE — Patient Instructions (Signed)
 Medication Instructions:  Your physician has recommended you make the following change in your medication:  1.) decrease furosemide  (Lasix ) to 20 mg - take one tablet daily  *If you need a refill on your cardiac medications before your next appointment, please call your pharmacy*  Lab Work: none  Testing/Procedures: none  Follow-Up: At [facility], you and your health needs are our priority.  As part of our continuing mission to provide you with exceptional heart care, our providers are all part of one team.  This team includes your primary Cardiologist (physician) and Advanced Practice Providers or APPs (Physician Assistants and Nurse Practitioners) who all work together to provide you with the care you need, when you need it.  Your next appointment:   12 month(s)  Provider:   Oneil Parchment, MD

## 2024-07-29 NOTE — Progress Notes (Unsigned)
 Cardiology Office Note:  .   Date:  07/30/2024  ID:  Catherine Leon, DOB [birth year], MRN [id number] PCP: Clarice Nottingham, MD  [facility] Providers Cardiologist:  Oneil Parchment, MD     History of Present Illness: Catherine Leon is a 83 y.o. female Discussed the use of AI scribe   History of Present Illness Catherine Leon is an 83 year old female with chronic systolic heart failure who presents for follow-up.  She has chronic systolic heart failure, classified as NYHA class I-II, with an ejection fraction of 40-50% as of 08-24-2021. She continues on goal-directed medical therapy and experiences no chest pain or swelling. She reports circulation issues in her fingers, with the tips becoming white and cold upon waking, which improve with movement.  She has a history of diabetes, with a prior hemoglobin A1c of 6.7. She is currently on Farxiga 10 mg daily and her last A1c was around 6.7 to 6.8. She sees a publishing rights manager for her diabetes management.  She has chronic kidney disease stage 3A and is on Entresto . She mentions needing a refill for Entresto  and her creatinine was 1.2 in July.  She has hyperlipidemia and is on Crestor  10 mg daily. Her LDL was 61, and her hemoglobin was 12.8.  She underwent a coronary CT scan on August 11, 2020, which showed a calcium  score of 26, placing her in the 30th percentile, with mild non-obstructive coronary disease.  She also takes potassium supplements.  Her husband, Patsy, had a massive heart attack in 2024/02/22 of last year and passed away in 2024/08/24.  She traveled to [place] and [place], visiting her daughter who lives abroad in [place]. She enjoys wine with meals and has a supportive family and church community.       Studies Reviewed: Catherine   EKG Interpretation Date/Time:  Thursday July 29 2024 16:39:34 EST Ventricular Rate:  79 PR Interval:  184 QRS Duration:  106 QT Interval:  398 QTC Calculation: 456 R Axis:   -72  Text  Interpretation: Normal sinus rhythm with sinus arrhythmia Possible Left atrial enlargement Left axis deviation Pulmonary disease pattern Incomplete left bundle branch block When compared with ECG of 10-Jul-2023 11:48, Nonspecific T wave abnormality, improved in Lateral leads Confirmed by Parchment Oneil ([id number]) on 07/29/2024 4:42:18 PM    Results LABS Creatinine: 1.2 (03/2024) Hemoglobin: 12.8 (03/2024) LDL: 61 (03/2024)  RADIOLOGY Coronary CT: Calcium  score 26, 30th percentile, mild nonobstructive coronary artery disease (08/11/2020) Risk Assessment/Calculations:            Physical Exam:   VS:  BP 126/70 (BP Location: Right Arm, Patient Position: Sitting, Cuff Size: Normal)   Pulse 79   Ht 5' 4.5 (1.638 m)   Wt 129 lb 12.8 oz (58.9 kg)   SpO2 94%   BMI 21.94 kg/m    Wt Readings from Last 3 Encounters:  07/29/24 129 lb 12.8 oz (58.9 kg)  07/10/23 132 lb 6.4 oz (60.1 kg)  07/23/22 126 lb (57.2 kg)    GEN: Well nourished, well developed in no acute distress NECK: No JVD; No carotid bruits CARDIAC: RRR, no murmurs, no rubs, no gallops RESPIRATORY:  Clear to auscultation without rales, wheezing or rhonchi  ABDOMEN: Soft, non-tender, non-distended EXTREMITIES:  No edema; No deformity   ASSESSMENT AND PLAN: .    Assessment and Plan Assessment & Plan Chronic systolic heart failure with preserved ejection fraction (NYHA class I-II) Chronic systolic heart failure with preserved ejection  fraction, NYHA class I-II, well-managed with no recent exacerbations or symptoms of fluid overload. - Continue Entresto  24/26 mg BID - Decreased Lasix  to 20 mg daily - Refilled Entresto  prescription  Type 2 diabetes mellitus Type 2 diabetes mellitus, previously managed with Farxiga 10 mg daily. Last recorded hemoglobin A1c was 6.7%. - Continue Farxiga 10 mg daily  Chronic kidney disease stage 3a Chronic kidney disease stage 3a, well-managed with creatinine at 1.28 mg/dL as of July. - Continue  current management  Hyperlipidemia Hyperlipidemia, well-controlled with Crestor  10 mg daily. LDL cholesterol is at 61 mg/dL. - Continue Crestor  10 mg daily  Atherosclerotic heart disease of native coronary artery without angina Atherosclerotic heart disease of native coronary artery without angina. Previous coronary CT scan showed mild nonobstructed coronary disease with a calcium  score of 26 (30th percentile). - Continue current management         Dispo: 1 yr  Signed, Oneil Parchment, MD

## 2024-07-30 DIAGNOSIS — H16141 Punctate keratitis, right eye: Secondary | ICD-10-CM | POA: Diagnosis not present

## 2024-08-02 ENCOUNTER — Other Ambulatory Visit (HOSPITAL_COMMUNITY): Payer: Self-pay

## 2024-08-02 MED ORDER — ROSUVASTATIN CALCIUM 10 MG PO TABS
10.0000 mg | ORAL_TABLET | Freq: Every day | ORAL | 3 refills | Status: AC
  Filled 2024-08-02: qty 30, 30d supply, fill #0
  Filled 2024-09-30: qty 30, 30d supply, fill #1

## 2024-08-02 MED ORDER — POTASSIUM CHLORIDE CRYS ER 20 MEQ PO TBCR
20.0000 meq | EXTENDED_RELEASE_TABLET | Freq: Every day | ORAL | 3 refills | Status: AC
  Filled 2024-08-02: qty 30, 30d supply, fill #0
  Filled 2024-09-30: qty 90, 90d supply, fill #0

## 2024-08-03 ENCOUNTER — Other Ambulatory Visit: Payer: Self-pay

## 2024-08-18 ENCOUNTER — Telehealth: Payer: Self-pay | Admitting: Cardiology

## 2024-08-18 ENCOUNTER — Other Ambulatory Visit (HOSPITAL_COMMUNITY): Payer: Self-pay

## 2024-08-18 MED ORDER — SACUBITRIL-VALSARTAN 24-26 MG PO TABS
1.0000 | ORAL_TABLET | Freq: Two times a day (BID) | ORAL | 3 refills | Status: AC
  Filled 2024-08-18: qty 180, 90d supply, fill #0

## 2024-08-18 NOTE — Telephone Encounter (Signed)
 Spoke with patient and advised doing away with secondary to the medication being available in generic.  Patient currently uses [facility] outpatient pharmacy.  Reached out and current copay zero Advised patient and sent Rx  Explained to patient that start of new year price may be different but they are unable to tell until then

## 2024-08-18 NOTE — Telephone Encounter (Signed)
 Pt c/o medication issue:  1. Name of Medication: sacubitril -valsartan  (ENTRESTO ) 24-26 MG   2. How are you currently taking this medication (dosage and times per day)?  3. Are you having a reaction (difficulty breathing--STAT)?   4. What is your medication issue? Pt states she was told by the pharmacy that this medication will no longer be covered by patient assistance

## 2024-08-19 ENCOUNTER — Other Ambulatory Visit: Payer: Self-pay

## 2024-08-19 ENCOUNTER — Other Ambulatory Visit (HOSPITAL_COMMUNITY): Payer: Self-pay

## 2024-09-30 ENCOUNTER — Other Ambulatory Visit: Payer: Self-pay

## 2024-09-30 ENCOUNTER — Other Ambulatory Visit (HOSPITAL_COMMUNITY): Payer: Self-pay

## 2024-10-01 ENCOUNTER — Encounter (HOSPITAL_BASED_OUTPATIENT_CLINIC_OR_DEPARTMENT_OTHER): Payer: Self-pay | Admitting: Emergency Medicine

## 2024-10-01 ENCOUNTER — Emergency Department (HOSPITAL_BASED_OUTPATIENT_CLINIC_OR_DEPARTMENT_OTHER)

## 2024-10-01 ENCOUNTER — Other Ambulatory Visit: Payer: Self-pay

## 2024-10-01 ENCOUNTER — Inpatient Hospital Stay (HOSPITAL_BASED_OUTPATIENT_CLINIC_OR_DEPARTMENT_OTHER)
Admission: EM | Admit: 2024-10-01 | Discharge: 2024-10-05 | DRG: 871 | Disposition: A | Discharge status: Home / Self Care | Attending: Internal Medicine | Admitting: Internal Medicine

## 2024-10-01 DIAGNOSIS — Z800 Family history of malignant neoplasm of digestive organs: Secondary | ICD-10-CM | POA: Diagnosis not present

## 2024-10-01 DIAGNOSIS — Z9221 Personal history of antineoplastic chemotherapy: Secondary | ICD-10-CM

## 2024-10-01 DIAGNOSIS — I428 Other cardiomyopathies: Secondary | ICD-10-CM | POA: Diagnosis present

## 2024-10-01 DIAGNOSIS — E119 Type 2 diabetes mellitus without complications: Secondary | ICD-10-CM

## 2024-10-01 DIAGNOSIS — Z7984 Long term (current) use of oral hypoglycemic drugs: Secondary | ICD-10-CM

## 2024-10-01 DIAGNOSIS — E1122 Type 2 diabetes mellitus with diabetic chronic kidney disease: Secondary | ICD-10-CM | POA: Diagnosis present

## 2024-10-01 DIAGNOSIS — I129 Hypertensive chronic kidney disease with stage 1 through stage 4 chronic kidney disease, or unspecified chronic kidney disease: Secondary | ICD-10-CM | POA: Diagnosis present

## 2024-10-01 DIAGNOSIS — E785 Hyperlipidemia, unspecified: Secondary | ICD-10-CM | POA: Diagnosis present

## 2024-10-01 DIAGNOSIS — Z853 Personal history of malignant neoplasm of breast: Secondary | ICD-10-CM

## 2024-10-01 DIAGNOSIS — K5900 Constipation, unspecified: Secondary | ICD-10-CM | POA: Diagnosis present

## 2024-10-01 DIAGNOSIS — R197 Diarrhea, unspecified: Secondary | ICD-10-CM | POA: Diagnosis present

## 2024-10-01 DIAGNOSIS — Z833 Family history of diabetes mellitus: Secondary | ICD-10-CM | POA: Diagnosis not present

## 2024-10-01 DIAGNOSIS — Z87891 Personal history of nicotine dependence: Secondary | ICD-10-CM | POA: Diagnosis not present

## 2024-10-01 DIAGNOSIS — D571 Sickle-cell disease without crisis: Secondary | ICD-10-CM | POA: Diagnosis present

## 2024-10-01 DIAGNOSIS — J159 Unspecified bacterial pneumonia: Secondary | ICD-10-CM | POA: Diagnosis present

## 2024-10-01 DIAGNOSIS — Z1152 Encounter for screening for COVID-19: Secondary | ICD-10-CM

## 2024-10-01 DIAGNOSIS — Z923 Personal history of irradiation: Secondary | ICD-10-CM

## 2024-10-01 DIAGNOSIS — Z888 Allergy status to other drugs, medicaments and biological substances status: Secondary | ICD-10-CM

## 2024-10-01 DIAGNOSIS — N1831 Chronic kidney disease, stage 3a: Secondary | ICD-10-CM | POA: Diagnosis present

## 2024-10-01 DIAGNOSIS — A419 Sepsis, unspecified organism: Secondary | ICD-10-CM

## 2024-10-01 DIAGNOSIS — I10 Essential (primary) hypertension: Secondary | ICD-10-CM

## 2024-10-01 DIAGNOSIS — N179 Acute kidney failure, unspecified: Secondary | ICD-10-CM | POA: Diagnosis present

## 2024-10-01 DIAGNOSIS — E860 Dehydration: Secondary | ICD-10-CM | POA: Diagnosis present

## 2024-10-01 DIAGNOSIS — J189 Pneumonia, unspecified organism: Secondary | ICD-10-CM | POA: Diagnosis present

## 2024-10-01 DIAGNOSIS — Z803 Family history of malignant neoplasm of breast: Secondary | ICD-10-CM

## 2024-10-01 DIAGNOSIS — Z8419 Family history of other disorders of kidney and ureter: Secondary | ICD-10-CM | POA: Diagnosis not present

## 2024-10-01 DIAGNOSIS — Z8249 Family history of ischemic heart disease and other diseases of the circulatory system: Secondary | ICD-10-CM | POA: Diagnosis not present

## 2024-10-01 DIAGNOSIS — I2510 Atherosclerotic heart disease of native coronary artery without angina pectoris: Secondary | ICD-10-CM | POA: Diagnosis present

## 2024-10-01 DIAGNOSIS — M7989 Other specified soft tissue disorders: Secondary | ICD-10-CM | POA: Diagnosis not present

## 2024-10-01 DIAGNOSIS — Z79899 Other long term (current) drug therapy: Secondary | ICD-10-CM

## 2024-10-01 DIAGNOSIS — D72829 Elevated white blood cell count, unspecified: Secondary | ICD-10-CM

## 2024-10-01 DIAGNOSIS — R059 Cough, unspecified: Secondary | ICD-10-CM

## 2024-10-01 LAB — CBC WITH DIFFERENTIAL/PLATELET
Abs Immature Granulocytes: 0.13 K/uL — ABNORMAL HIGH (ref 0.00–0.07)
Basophils Absolute: 0.1 K/uL (ref 0.0–0.1)
Basophils Relative: 0 %
Eosinophils Absolute: 0 K/uL (ref 0.0–0.5)
Eosinophils Relative: 0 %
HCT: 34.3 % — ABNORMAL LOW (ref 36.0–46.0)
Hemoglobin: 11.8 g/dL — ABNORMAL LOW (ref 12.0–15.0)
Immature Granulocytes: 1 %
Lymphocytes Relative: 2 %
Lymphs Abs: 0.4 K/uL — ABNORMAL LOW (ref 0.7–4.0)
MCH: 27.2 pg (ref 26.0–34.0)
MCHC: 34.4 g/dL (ref 30.0–36.0)
MCV: 79 fL — ABNORMAL LOW (ref 80.0–100.0)
Monocytes Absolute: 1 K/uL (ref 0.1–1.0)
Monocytes Relative: 5 %
Neutro Abs: 18 K/uL — ABNORMAL HIGH (ref 1.7–7.7)
Neutrophils Relative %: 92 %
Platelets: 236 K/uL (ref 150–400)
RBC: 4.34 MIL/uL (ref 3.87–5.11)
RDW: 14.4 % (ref 11.5–15.5)
WBC: 19.6 K/uL — ABNORMAL HIGH (ref 4.0–10.5)
nRBC: 0 % (ref 0.0–0.2)

## 2024-10-01 LAB — COMPREHENSIVE METABOLIC PANEL WITH GFR
ALT: 38 U/L (ref 0–44)
AST: 47 U/L — ABNORMAL HIGH (ref 15–41)
Albumin: 4 g/dL (ref 3.5–5.0)
Alkaline Phosphatase: 118 U/L (ref 38–126)
Anion gap: 13 (ref 5–15)
BUN: 35 mg/dL — ABNORMAL HIGH (ref 8–23)
CO2: 21 mmol/L — ABNORMAL LOW (ref 22–32)
Calcium: 9.6 mg/dL (ref 8.9–10.3)
Chloride: 102 mmol/L (ref 98–111)
Creatinine, Ser: 2.1 mg/dL — ABNORMAL HIGH (ref 0.44–1.00)
GFR, Estimated: 23 mL/min — ABNORMAL LOW
Glucose, Bld: 132 mg/dL — ABNORMAL HIGH (ref 70–99)
Potassium: 3.9 mmol/L (ref 3.5–5.1)
Sodium: 136 mmol/L (ref 135–145)
Total Bilirubin: 1.3 mg/dL — ABNORMAL HIGH (ref 0.0–1.2)
Total Protein: 7.7 g/dL (ref 6.5–8.1)

## 2024-10-01 LAB — RESP PANEL BY RT-PCR (RSV, FLU A&B, COVID)  RVPGX2
Influenza A by PCR: NEGATIVE
Influenza B by PCR: NEGATIVE
Resp Syncytial Virus by PCR: NEGATIVE
SARS Coronavirus 2 by RT PCR: NEGATIVE

## 2024-10-01 LAB — GLUCOSE, CAPILLARY: Glucose-Capillary: 86 mg/dL (ref 70–99)

## 2024-10-01 LAB — LACTIC ACID, PLASMA: Lactic Acid, Venous: 1.7 mmol/L (ref 0.5–1.9)

## 2024-10-01 MED ORDER — LINAGLIPTIN 5 MG PO TABS: 5.0000 mg | ORAL_TABLET | Freq: Every day | ORAL | Status: DC

## 2024-10-01 MED ORDER — ROSUVASTATIN CALCIUM 5 MG PO TABS
10.0000 mg | ORAL_TABLET | Freq: Every day | ORAL | Status: DC
  Administered 2024-10-02 – 2024-10-05 (×4): 10 mg via ORAL
  Filled 2024-10-01: qty 1
  Filled 2024-10-01 (×2): qty 2
  Filled 2024-10-01 (×2): qty 1
  Filled 2024-10-01: qty 2

## 2024-10-01 MED ORDER — ONDANSETRON HCL 4 MG/2ML IJ SOLN: 4.0000 mg | Freq: Four times a day (QID) | INTRAMUSCULAR | Status: DC | PRN

## 2024-10-01 MED ORDER — ONDANSETRON HCL 4 MG PO TABS
4.0000 mg | ORAL_TABLET | Freq: Four times a day (QID) | ORAL | Status: AC | PRN
  Filled 2024-10-01: qty 1

## 2024-10-01 MED ORDER — FUROSEMIDE 20 MG PO TABS
20.0000 mg | ORAL_TABLET | Freq: Every day | ORAL | Status: DC
  Administered 2024-10-01: 20 mg via ORAL
  Filled 2024-10-01: qty 1

## 2024-10-01 MED ORDER — INSULIN ASPART 100 UNIT/ML IJ SOLN: 0.0000 [IU] | Freq: Every day | INTRAMUSCULAR | Status: DC

## 2024-10-01 MED ORDER — SODIUM CHLORIDE 0.9 % IV SOLN
1.0000 g | Freq: Once | INTRAVENOUS | Status: AC
  Administered 2024-10-01: 1 g via INTRAVENOUS
  Filled 2024-10-01: qty 10

## 2024-10-01 MED ORDER — SODIUM CHLORIDE 0.9 % IV SOLN
100.0000 mg | Freq: Two times a day (BID) | INTRAVENOUS | Status: DC
  Administered 2024-10-01 – 2024-10-03 (×4): 100 mg via INTRAVENOUS
  Filled 2024-10-01 (×6): qty 100

## 2024-10-01 MED ORDER — ACETAMINOPHEN 325 MG PO TABS: 650.0000 mg | ORAL_TABLET | Freq: Once | ORAL | Status: DC

## 2024-10-01 MED ORDER — ACETAMINOPHEN 650 MG RE SUPP: 650.0000 mg | Freq: Four times a day (QID) | RECTAL | Status: DC | PRN

## 2024-10-01 MED ORDER — ACETAMINOPHEN 325 MG PO TABS
650.0000 mg | ORAL_TABLET | Freq: Four times a day (QID) | ORAL | Status: AC | PRN
  Administered 2024-10-01: 650 mg via ORAL
  Filled 2024-10-01 (×2): qty 2

## 2024-10-01 MED ORDER — DOXYCYCLINE HYCLATE 100 MG PO TABS
100.0000 mg | ORAL_TABLET | Freq: Once | ORAL | Status: AC
  Administered 2024-10-01: 100 mg via ORAL
  Filled 2024-10-01: qty 1

## 2024-10-01 MED ORDER — HYDRALAZINE HCL 20 MG/ML IJ SOLN: 5.0000 mg | Freq: Four times a day (QID) | INTRAMUSCULAR | Status: DC | PRN

## 2024-10-01 MED ORDER — HEPARIN SODIUM (PORCINE) 5000 UNIT/ML IJ SOLN
5000.0000 [IU] | Freq: Two times a day (BID) | INTRAMUSCULAR | Status: DC
  Administered 2024-10-01 – 2024-10-02 (×3): 5000 [IU] via SUBCUTANEOUS
  Filled 2024-10-01 (×3): qty 1

## 2024-10-01 MED ORDER — SACUBITRIL-VALSARTAN 24-26 MG PO TABS: 1.0000 | ORAL_TABLET | Freq: Two times a day (BID) | ORAL | Status: DC

## 2024-10-01 MED ORDER — SODIUM CHLORIDE 0.9 % IV SOLN
2.0000 g | INTRAVENOUS | Status: AC
  Administered 2024-10-02 – 2024-10-05 (×4): 2 g via INTRAVENOUS
  Filled 2024-10-01 (×4): qty 20

## 2024-10-01 MED ORDER — INSULIN ASPART 100 UNIT/ML IJ SOLN
0.0000 [IU] | Freq: Three times a day (TID) | INTRAMUSCULAR | Status: DC
  Administered 2024-10-02: 1 [IU] via SUBCUTANEOUS

## 2024-10-01 MED ORDER — LACTATED RINGERS IV SOLN: INTRAVENOUS | Status: DC

## 2024-10-01 MED ORDER — DAPAGLIFLOZIN PROPANEDIOL 10 MG PO TABS: 10.0000 mg | ORAL_TABLET | Freq: Every day | ORAL | Status: DC

## 2024-10-01 MED ORDER — SODIUM CHLORIDE 0.9 % IV BOLUS
700.0000 mL | Freq: Once | INTRAVENOUS | Status: AC
  Administered 2024-10-01: 700 mL via INTRAVENOUS

## 2024-10-01 MED ORDER — ACETAMINOPHEN 325 MG PO TABS
650.0000 mg | ORAL_TABLET | Freq: Once | ORAL | Status: AC
  Administered 2024-10-01: 650 mg via ORAL
  Filled 2024-10-01: qty 2

## 2024-10-01 MED ORDER — LACTATED RINGERS IV SOLN: INTRAVENOUS | Status: AC

## 2024-10-01 MED ORDER — HYDROCOD POLI-CHLORPHE POLI ER 10-8 MG/5ML PO SUER
5.0000 mL | Freq: Every evening | ORAL | Status: DC | PRN
  Administered 2024-10-01 – 2024-10-03 (×2): 5 mL via ORAL
  Filled 2024-10-01 (×2): qty 5

## 2024-10-01 MED ORDER — POTASSIUM CHLORIDE CRYS ER 20 MEQ PO TBCR: 20.0000 meq | EXTENDED_RELEASE_TABLET | Freq: Every day | ORAL | Status: DC

## 2024-10-01 MED ORDER — IPRATROPIUM-ALBUTEROL 0.5-2.5 (3) MG/3ML IN SOLN
3.0000 mL | Freq: Four times a day (QID) | RESPIRATORY_TRACT | Status: DC | PRN
  Filled 2024-10-01: qty 3

## 2024-10-01 MED ORDER — GUAIFENESIN-DM 100-10 MG/5ML PO SYRP
5.0000 mL | ORAL_SOLUTION | ORAL | Status: DC | PRN
  Administered 2024-10-02 – 2024-10-03 (×2): 5 mL via ORAL
  Filled 2024-10-01 (×2): qty 5

## 2024-10-01 MED ORDER — SODIUM CHLORIDE 0.9 % IV BOLUS
1000.0000 mL | Freq: Once | INTRAVENOUS | Status: AC
  Administered 2024-10-01: 1000 mL via INTRAVENOUS

## 2024-10-01 NOTE — ED Notes (Signed)
 Called [facility] for consult to [employer] @17 :12.  Spoke with Nataya

## 2024-10-01 NOTE — Assessment & Plan Note (Addendum)
 With markedly elevated leukocytosis and renal involvement C. difficile and GI panel have been ordered on admission Enteric precaution until negative for infectious diarrhea No antidiarrheal medication can be given until infectious diarrhea has been ruled out

## 2024-10-01 NOTE — ED Provider Notes (Signed)
 " [place] EM[facility]GENCY DEPARTMENT AT [facility] Provider Note   CSN: [id number] Arrival date & time: 10/01/24  1303     Patient presents with: URI   Catherine Leon is a 84 y.o. female presenting to the emergency department with complaint of cough and congestion ongoing for about 2 to 3 weeks.  Patient reports that she had the flu shot at the very end of December.  2 days later she developed flulike symptoms with bodyaches cough and congestion.  She says she felt like she improved from those symptoms but then again about 3 days ago developed persistent coughing, very watery diarrhea.  She feels weak.  She reports she does have some pleuritic left-sided chest pain.  She denies history of smoking or underlying lung disease.  She reports she has had a bad appetite for 2 weeks.  Her son is present at the bedside as well   HPI     Prior to Admission medications  Medication Sig Start Date End Date Taking? Authorizing Provider  FARXIGA  10 MG TABS tablet Take 10 mg by mouth daily. 08/21/21   [provider]  furosemide  (LASIX ) 20 MG tablet Take 1 tablet (20 mg total) by mouth daily. 07/29/24   Jeffrie Oneil BROCKS, MD  linagliptin  (TRADJENTA ) 5 MG TABS tablet Take 5 mg by mouth daily. 11/04/22   [provider]  potassium chloride  SA (KLOR-CON  M20) 20 MEQ tablet Take 1 tablet (20 mEq total) by mouth daily. 08/02/24   Jeffrie Oneil BROCKS, MD  rosuvastatin  (CRESTOR ) 10 MG tablet Take 1 tablet (10 mg total) by mouth daily. 08/02/24   Jeffrie Oneil BROCKS, MD  sacubitril -valsartan  (ENTRESTO ) 24-26 MG Take 1 tablet by mouth 2 (two) times daily. 08/18/24   Jeffrie Oneil BROCKS, MD    Allergies: Dapagliflozin  pro-metformin er    Review of Systems  Updated Vital Signs BP (!) 150/74   Pulse (!) 119   Temp (!) 101.9 F (38.8 C)   Resp 19   Ht 5' 4.5 (1.638 m)   Wt 59.4 kg   SpO2 100%   BMI 22.14 kg/m   Physical Exam Constitutional:      General: She is not in acute distress. HENT:      Head: Normocephalic and atraumatic.  Eyes:     Conjunctiva/sclera: Conjunctivae normal.     Pupils: Pupils are equal, round, and reactive to light.  Cardiovascular:     Rate and Rhythm: Regular rhythm. Tachycardia present.  Pulmonary:     Effort: Pulmonary effort is normal. No respiratory distress.     Breath sounds: Normal breath sounds.  Abdominal:     General: There is no distension.     Tenderness: There is no abdominal tenderness.  Skin:    General: Skin is warm and dry.  Neurological:     General: No focal deficit present.     Mental Status: She is alert. Mental status is at baseline.  Psychiatric:        Mood and Affect: Mood normal.        Behavior: Behavior normal.     (all labs ordered are listed, but only abnormal results are displayed) Labs Reviewed  COMPREHENSIVE METABOLIC PANEL WITH GFR - Abnormal; Notable for the following components:      Result Value   CO2 21 (*)    Glucose, Bld 132 (*)    BUN 35 (*)    Creatinine, Ser 2.10 (*)    AST 47 (*)  Total Bilirubin 1.3 (*)    GFR, Estimated 23 (*)    All other components within normal limits  CBC WITH DIFFERENTIAL/PLATELET - Abnormal; Notable for the following components:   WBC 19.6 (*)    Hemoglobin 11.8 (*)    HCT 34.3 (*)    MCV 79.0 (*)    Neutro Abs 18.0 (*)    Lymphs Abs 0.4 (*)    Abs Immature Granulocytes 0.13 (*)    All other components within normal limits  RESP PANEL BY RT-PCR (RSV, FLU A&B, COVID)  RVPGX2  C DIFFICILE QUICK SCREEN W PCR REFLEX    LACTIC ACID, PLASMA  URINALYSIS, W/ REFLEX TO CULTURE (INFECTION SUSPECTED)    EKG: None  Radiology: DG Chest 2 View Result Date: 10/01/2024 CLINICAL DATA:  Cough EXAM: CHEST - 2 VIEW COMPARISON:  April 18, 2022 FINDINGS: Stable cardiomediastinal silhouette. Mildly increased left basilar opacity is noted concerning for pneumonia or atelectasis. Right lung is unremarkable. Bony thorax is unremarkable. IMPRESSION: Mildly increased left basilar  opacity is noted concerning for pneumonia or atelectasis. Electronically Signed   By: Lynwood Landy Raddle M.D.   On: 10/01/2024 14:19     .Critical Care  Performed by: Cottie Donnice PARAS, MD Authorized by: Cottie Donnice PARAS, MD   Critical care provider statement:    Critical care time (minutes):  40   Critical care time was exclusive of:  Separately billable procedures and treating other patients   Critical care was necessary to treat or prevent imminent or life-threatening deterioration of the following conditions:  Sepsis   Critical care was time spent personally by me on the following activities:  Ordering and performing treatments and interventions, ordering and review of laboratory studies, ordering and review of radiographic studies, pulse oximetry, review of old charts, examination of patient and evaluation of patient's response to treatment    Medications Ordered in the ED  cefTRIAXone  (ROCEPHIN ) 1 g in sodium chloride  0.9 % 100 mL IVPB (1 g Intravenous New Bag/Given 10/01/24 1433)  sodium chloride  0.9 % bolus 700 mL (has no administration in time range)  acetaminophen  (TYLENOL ) tablet 650 mg (650 mg Oral Given 10/01/24 1322)  sodium chloride  0.9 % bolus 1,000 mL (1,000 mLs Intravenous New Bag/Given 10/01/24 1423)  doxycycline  (VIBRA -TABS) tablet 100 mg (100 mg Oral Given 10/01/24 1430)                                    Medical Decision Making Amount and/or Complexity of Data Reviewed Labs: ordered. Radiology: ordered.  Risk OTC drugs. Prescription drug management. Decision regarding hospitalization.   This patient presents to the ED with concern for fever, cough, congestion, body aches, weakness, diarrhea. This involves an extensive number of treatment options, and is a complaint that carries with it a high risk of complications and morbidity.  The differential diagnosis includes viral illness including influenza versus superimposed bacterial infection or pneumonia versus  other  Patient does not have any immediate risk factors for C. difficile.  She has never had C. difficile before.  She has not been on antibiotics recently.  Over, she does describe near incontinence with her diarrhea, and so I think C. difficile testing would be reasonable  Co-morbidities that complicate the patient evaluation: Advanced age, high risk of pneumonia and pulmonary complication  I ordered and personally interpreted labs.  The pertinent results include: Elevation of BUN and creatinine consistent with dehydration.  White blood cell count elevated at 19.6.  Lactate within normal limits.  COVID and flu are negative.    I ordered imaging studies including x-ray of the chest I independently visualized and interpreted imaging which showed left-sided infiltrate I agree with the radiologist interpretation  The patient was maintained on a cardiac monitor.  I personally viewed and interpreted the cardiac monitored which showed an underlying rhythm of: Sinus tachycardia   I ordered medication including IV fluids and antibiotics for pneumonia and dehydration and tachycardia.  I have reviewed the patients home medicines and have made adjustments as needed  Test Considered: Low suspicion for acute PE, low suspicion for infectious colitis.  I do not think the patient is requiring emergent CT imaging of the abdomen.   After the interventions noted above, I reevaluated the patient and found that they have: stayed the same   Dispostion:  After consideration of the diagnostic results and the patients response to treatment, I feel that the patent would benefit from medical admission      Final diagnoses:  Pneumonia of left lower lobe due to infectious organism  Sepsis, due to unspecified organism, unspecified whether acute organ dysfunction present [facility])  Diarrhea, unspecified type    ED Discharge Orders     None          Cottie Donnice PARAS, MD 10/01/24 1502  "

## 2024-10-01 NOTE — Assessment & Plan Note (Deleted)
 Check MRSA PCR, urine Legionella antigen

## 2024-10-01 NOTE — Assessment & Plan Note (Addendum)
 Home linagliptin  will not be resumed on admission Insulin  SSI with at bedtime coverage ordered, renal dosing Goal inpatient blood glucose levels 140-180

## 2024-10-01 NOTE — Assessment & Plan Note (Signed)
 Suspect secondary to left lower lobe pneumonia however in setting of diarrhea with markedly elevated leukocytosis, C. difficile/infectious diarrhea cannot be excluded at this time Recheck CBC in the a.m.  1/19 - resolved

## 2024-10-01 NOTE — Assessment & Plan Note (Addendum)
 Suspect prerenal secondary to GI loss and poor p.o. intake Status post 1.7 mL liter bolus of sodium chloride  per EDP Continue with LR infusion at 125 mL/h, 30 hours ordered Holding home furosdemide, and entresto , AM team to resume when benefits outweigh risk Home Farxiga  being held on admission as patient is volume depleted with acute kidney injury Recheck BMP in the a.m.

## 2024-10-01 NOTE — Assessment & Plan Note (Signed)
 Robitussin DM as needed for cough during the day and to loosen phlegm Tussionex nightly as needed for coughing at night Incentive spirometry, flutter valve

## 2024-10-01 NOTE — Assessment & Plan Note (Addendum)
 Left lower lobe pneumonia Continue with doxycycline  100 mg IV twice daily, ceftriaxone  2 g IV daily to complete a minimum of 5-day course DuoNebs as needed for wheezing and shortness of breath Check MRSA PCR, Legionella Incentive spirometry, flutter valve

## 2024-10-01 NOTE — Assessment & Plan Note (Signed)
 Strict I's and O's Does not appear to be in acute exacerbation at this time

## 2024-10-01 NOTE — Assessment & Plan Note (Signed)
 Home rosuvastatin 10 mg daily resumed

## 2024-10-01 NOTE — Hospital Course (Addendum)
 [name] is an 84 year old female with history of hypertension, non-insulin -dependent diabetes mellitus, history of breast cancer, hyperlipidemia.  10/01/24: She presents to ED for chief concerns of cough, congestion, body aches, poor p.o. intake for approximately 2 weeks.  Vitals at the time of my evaluation showed Tmax of 101.9, respiration rate 19, heart rate 119, blood pressure 150/74, SpO2 100% on room air.  Serum sodium is 136, potassium 3.9, chloride 102, bicarb 21, BUN of 36, serum creatinine of 2.10, eGFR 23, nonfasting blood glucose 132, WBC 19.6, hemoglobin 11.8, platelets of 236.  Influenza A/influenza B/RSV/COVID PCR were negative.  Lactic acid 1.7.  ED treatment: Acetaminophen  650 mg p.o. one-time dose, doxycycline  100 mg p.o. one-time dose, ceftriaxone  1 g IV one-time dose, sodium chloride  1 L bolus, sodium chloride  700 mL liter bolus.  10/01/2024: I evaluated patient and completed history and physical via telemedicine encounter.  1/18: Patient care transferred to [facility] service.  1/19: I resumed care of patient. Day 4 of abx.  At bedside, patient states she is doing so much better than before.  She reports significant she has improved a lot since the day she was admitted to the hospital.  She reports he slept so much better last night when compared to sleeping in the hospital.  Patient reports no bowel movement since the 15th.  Senna docusate 1 tablet, miralax  17g packet once have been ordered.

## 2024-10-01 NOTE — Assessment & Plan Note (Signed)
 Home Entresto  and furosemide  not resumed on admission in setting of acute kidney injury Will add on low dose norvasc ( with parameters) as a bridge  Resume home regimen as clinically indicated   1/19 -renal function has resolved.  Home Farxiga  and Entresto  have been resumed for 10/05/2024

## 2024-10-01 NOTE — ED Triage Notes (Signed)
 Pt reports ongoing cough, congestion, body aches, shob, decreased appetite x 2 weeks.   Denies fever.

## 2024-10-01 NOTE — H&P (Addendum)
 " History and Physical - Telemedicine  [name] FMW:[id number] DOB: 04/10/1941 DOA: 10/01/2024  PCP: Catherine Nottingham, MD Patient coming from: home  Referring provider: Dr. Cottie, EDP Telemedicine provider: Dr. Sherre Patient location: [facility] med Center Referring diagnosis: Left lower lobe pneumonia Patient name and DOB verified: Patient was able to verify her first and last name: Catherine Leon, date of birth: 12/12/1940. Patient consented to Telemedicine Evaluation: Yes RN virtual assistant: Geoffry Seip, RN Video encounter time and date: 10/01/24 and at approximately: 16:15  Chief Concern: Shortness of breath, cough, congestion  HPI: Catherine Leon is an 84 year old female with history of hypertension, non-insulin -dependent diabetes mellitus, history of breast cancer, hyperlipidemia.  10/01/24: She presents to ED for chief concerns of cough, congestion, body aches, poor p.o. intake for approximately 2 weeks.  Vitals at the time of my evaluation showed Tmax of 101.9, respiration rate 19, heart rate 119, blood pressure 150/74, SpO2 100% on room air.  Serum sodium is 136, potassium 3.9, chloride 102, bicarb 21, BUN of 36, serum creatinine of 2.10, eGFR 23, nonfasting blood glucose 132, WBC 19.6, hemoglobin 11.8, platelets of 236.  Influenza A/influenza B/RSV/COVID PCR were negative.  Lactic acid 1.7.  ED treatment: Acetaminophen  650 mg p.o. one-time dose, doxycycline  100 mg p.o. one-time dose, ceftriaxone  1 g IV one-time dose, sodium chloride  1 L bolus, sodium chloride  700 mL liter bolus.  10/01/2024: I evaluated patient and completed history and physical via telemedicine encounter. ------------------------------- At bedside, patient was able to confirm her first and last name, date of birth.  She reports that she got the flu shot on December 30 and she has been feeling sick since then's.  She reports she had generalized body aches, cough, congestion, chills at home.  Her cough was  initially thicker yellow sputum and then has transitioned to a light yellow sputum and/or white in color.  She reports she did not have fever at home.  She is surprised she has a fever in the hospital.  She denies known sick contacts.  She denies nausea, vomiting, blood in her urine, blood in her stool, syncope, loss of consciousness.  She reports she did develop diarrhea a couple of days ago.  She reports she does drink water and black tea but she has not had any appetite.  She just does not feel like eating.  Social history: She lives at home.  She denies tobacco use.  She quit smoking in 1984.  She infrequently drinks EtOH, she does not know when the last drink was but it was in 2025.  She denies recreational drug use.  She is retired and previously worked in actor.  ROS: Constitutional: no weight change, no fever ENT/Mouth: no sore throat, no rhinorrhea Eyes: no eye pain, no vision changes Cardiovascular: no chest pain, + dyspnea,  no edema, no palpitations Respiratory: + cough, + sputum, no wheezing Gastrointestinal: no nausea, no vomiting, + diarrhea, no constipation Genitourinary: no urinary incontinence, no dysuria, no hematuria Musculoskeletal: no arthralgias, no myalgias Skin: no skin lesions, no pruritus, Neuro: + weakness, no loss of consciousness, no syncope Psych: no anxiety, no depression, + decrease appetite Heme/Lymph: no bruising, no bleeding  ED Course: Discussed with EDP, patient requiring hospitalization for chief concerns of multilobar pneumonia and diarrhea.  Assessment/Plan  Principal Problem:   Community acquired pneumonia Active Problems:   Diarrhea   Left lower lobe pneumonia   AKI (acute kidney injury)   Leukocytosis   Hx of breast cancer  Nonischemic cardiomyopathy (HCC)   Diabetes mellitus type 2, noninsulin dependent (HCC)   Cough   Hyperlipidemia   Essential hypertension   Assessment and Plan:  * Community acquired pneumonia Left  lower lobe pneumonia Continue with doxycycline  100 mg IV twice daily, ceftriaxone  2 g IV daily to complete a minimum of 5-day course DuoNebs as needed for wheezing and shortness of breath Check MRSA PCR, Legionella Incentive spirometry, flutter valve  AKI (acute kidney injury) Suspect prerenal secondary to GI loss and poor p.o. intake Status post 1.7 mL liter bolus of sodium chloride  per EDP Continue with LR infusion at 125 mL/h, 30 hours ordered Holding home furosdemide, and entresto , AM team to resume when benefits outweigh risk Home Farxiga  being held on admission as patient is volume depleted with acute kidney injury Recheck BMP in the a.m.  Diarrhea With markedly elevated leukocytosis and renal involvement C. difficile and GI panel have been ordered on admission Enteric precaution until negative for infectious diarrhea No antidiarrheal medication can be given until infectious diarrhea has been ruled out  Leukocytosis Suspect secondary to left lower lobe pneumonia however in setting of diarrhea with markedly elevated leukocytosis, C. difficile/infectious diarrhea cannot be excluded at this time Recheck CBC in the a.m.  Hyperlipidemia Home rosuvastatin  10 mg daily resumed  Cough Robitussin DM as needed for cough during the day and to loosen phlegm Tussionex nightly as needed for coughing at night Incentive spirometry, flutter valve  Diabetes mellitus type 2, noninsulin dependent (HCC) Home linagliptin  will not be resumed on admission Insulin  SSI with at bedtime coverage ordered, renal dosing Goal inpatient blood glucose levels 140-180  Nonischemic cardiomyopathy (HCC) Strict I's and O's Does not appear to be in acute exacerbation at this time  Essential hypertension Home Entresto  and furosemide  not resumed on admission in setting of acute kidney injury Hydralazine  5 mg IV every 6 hours as needed for SBP greater 170 AM team to resume home antihypertensive medication when  benefits outweigh the risk  Chart reviewed.   DVT prophylaxis: Heparin  5000 units twice daily Code Status: Full code Diet: Heart healthy/carb modified Family Communication: Son, Curtistine has been updated at bedside Disposition Plan: Pending clinical course Consults called: None at this time Admission status: Telemetry  Past Medical History:  Diagnosis Date   Anemia    BMI 27.0-27.9,adult    Breast cancer (HCC)    Chronic kidney disease, stage 3a (HCC)    Constipation    Contact lens/glasses fitting    wears contacts or glasses   Diabetes mellitus    Family history of early CAD    Goiter    Hx of breast cancer 05/04/2012   Hx of radiation therapy    Hyperlipidemia    Hypertension    Mild CAD    Mild mitral regurgitation    Osteoarthritis    Palpitations    Personal history of chemotherapy 2001   Personal history of radiation therapy 2001   Post-menopausal    Raynaud's disease    Sickle cell anemia (HCC)    Urinary frequency    Wears partial dentures    partial top   Weight loss    Past Surgical History:  Procedure Laterality Date   BREAST LUMPECTOMY Right 2001   BREAST SURGERY  07/16/2000   rt lump-axillary dissectopn   CHOLECYSTECTOMY  1995 - approximate   COLONOSCOPY     LESION EXCISION WITH COMPLEX REPAIR Left 10/22/2016   Procedure: COMPLEX REPAIR LEFT CHEST 10 CM;  Surgeon: Earlis  Arelia, MD;  Location: [facility];  Service: Plastics;  Laterality: Left;   MASS EXCISION Right 06/15/2013   Procedure: EXCISION right neck MASS;  Surgeon: Elon CHRISTELLA Pacini, MD;  Location: [facility];  Service: General;  Laterality: Right;   MASTECTOMY  12/11/2009   RIGHT BREAST   TONSILLECTOMY     Social History:  reports that she quit smoking about 41 years ago. Her smoking use included cigarettes. She has never used smokeless tobacco. She reports current alcohol use. She reports that she does not use drugs.  Allergies[1] Family History  Problem  Relation Age of Onset   Heart disease Mother    Kidney disease Father    Diabetes Father    Colon cancer Paternal Aunt    Breast cancer Paternal Aunt 78 - 21   Family history: Family history reviewed and not pertinent.  Prior to Admission medications  Medication Sig Start Date End Date Taking? Authorizing Provider  FARXIGA  10 MG TABS tablet Take 10 mg by mouth daily. 08/21/21   [provider]  furosemide  (LASIX ) 20 MG tablet Take 1 tablet (20 mg total) by mouth daily. 07/29/24   Jeffrie Oneil BROCKS, MD  linagliptin  (TRADJENTA ) 5 MG TABS tablet Take 5 mg by mouth daily. 11/04/22   [provider]  potassium chloride  SA (KLOR-CON  M20) 20 MEQ tablet Take 1 tablet (20 mEq total) by mouth daily. 08/02/24   Jeffrie Oneil BROCKS, MD  rosuvastatin  (CRESTOR ) 10 MG tablet Take 1 tablet (10 mg total) by mouth daily. 08/02/24   Jeffrie Oneil BROCKS, MD  sacubitril -valsartan  (ENTRESTO ) 24-26 MG Take 1 tablet by mouth 2 (two) times daily. 08/18/24   Jeffrie Oneil BROCKS, MD   Physical Exam completed with assistance of: Geoffry Seip, RN, who was at bedside during this portion of the virtual encounter:  Vitals:   10/01/24 1535 10/01/24 1540 10/01/24 1700 10/01/24 1750  BP:  (!) 115/58 116/68 116/68  Pulse:  96 86   Resp:  20 (!) 21 (!) 22  Temp: 98.9 F (37.2 C)  98.9 F (37.2 C)   TempSrc: Oral  Oral   SpO2:  94% 98%   Weight:      Height:       Constitutional: appears age-appropriate, frail, NAD, calm Eyes: EOMI,  conjunctivae normal ENMT: Mucous membranes are moist. Hearing appropriate Neck: normal, supple, no masses, no thyromegaly Respiratory: Diminished lung sounds in the bilateral lower lobes, otherwise clear to auscultation the other lobes, no wheezing. Normal respiratory effort. No accessory muscle use.  Cardiovascular: Regular rate and rhythm, no murmurs. Abdomen: no tenderness. Bowel sounds positive.  Musculoskeletal: No joint deformity upper extremities. Good ROM, no contractures, no  atrophy. Skin: no rashes, ulcers on visible skin Neurologic: Strength is appropriate upper extremities.  Psychiatric: Normal judgment and insight. Alert and oriented x 3. Normal mood.   EKG: Not indicated at this time  Chest x-ray on Admission: I personally reviewed and I agree with radiologist reading as below.  DG Chest 2 View Result Date: 10/01/2024 CLINICAL DATA:  Cough EXAM: CHEST - 2 VIEW COMPARISON:  April 18, 2022 FINDINGS: Stable cardiomediastinal silhouette. Mildly increased left basilar opacity is noted concerning for pneumonia or atelectasis. Right lung is unremarkable. Bony thorax is unremarkable. IMPRESSION: Mildly increased left basilar opacity is noted concerning for pneumonia or atelectasis. Electronically Signed   By: Lynwood Seip Raddle M.D.   On: 10/01/2024 14:19    Labs on Admission: I have personally reviewed following labs  CBC: Recent Labs  Lab 10/01/24 1322  WBC 19.6*  NEUTROABS 18.0*  HGB 11.8*  HCT 34.3*  MCV 79.0*  PLT 236   Basic Metabolic Panel: Recent Labs  Lab 10/01/24 1322  NA 136  K 3.9  CL 102  CO2 21*  GLUCOSE 132*  BUN 35*  CREATININE 2.10*  CALCIUM  9.6   GFR: Estimated Creatinine Clearance: 17.6 mL/min (A) (by C-G formula based on SCr of 2.1 mg/dL (H)). Liver Function Tests: Recent Labs  Lab 10/01/24 1322  AST 47*  ALT 38  ALKPHOS 118  BILITOT 1.3*  PROT 7.7  ALBUMIN 4.0   Urine analysis:    Component Value Date/Time   COLORURINE YELLOW 10/17/2013 1933   APPEARANCEUR CLEAR 10/17/2013 1933   LABSPEC 1.005 10/17/2013 1933   PHURINE 6.0 10/17/2013 1933   GLUCOSEU NEGATIVE 10/17/2013 1933   HGBUR NEGATIVE 10/17/2013 1933   BILIRUBINUR NEGATIVE 10/17/2013 1933   KETONESUR NEGATIVE 10/17/2013 1933   PROTEINUR NEGATIVE 10/17/2013 1933   UROBILINOGEN 0.2 10/17/2013 1933   NITRITE NEGATIVE 10/17/2013 1933   LEUKOCYTESUR NEGATIVE 10/17/2013 1933   This document was prepared using Dragon Voice Recognition software and may  include unintentional dictation errors.  Dr. Sherre [facility] Location: [place]  If 7PM-7AM, please contact overnight-coverage provider If 7AM-7PM, please contact day attending provider [email or web address]  10/01/2024, 6:14 PM      [1]  Allergies Allergen Reactions   Dapagliflozin  Pro-Metformin Er     Other reaction(s): vertigo   "

## 2024-10-01 NOTE — ED Provider Notes (Signed)
 Admitted to hospitalist   Cottie Donnice PARAS, MD 10/01/24 [phone]

## 2024-10-02 DIAGNOSIS — N179 Acute kidney failure, unspecified: Secondary | ICD-10-CM | POA: Diagnosis not present

## 2024-10-02 DIAGNOSIS — J189 Pneumonia, unspecified organism: Secondary | ICD-10-CM | POA: Diagnosis not present

## 2024-10-02 LAB — COMPREHENSIVE METABOLIC PANEL WITH GFR
ALT: 36 U/L (ref 0–44)
AST: 52 U/L — ABNORMAL HIGH (ref 15–41)
Albumin: 3.4 g/dL — ABNORMAL LOW (ref 3.5–5.0)
Alkaline Phosphatase: 101 U/L (ref 38–126)
Anion gap: 13 (ref 5–15)
BUN: 28 mg/dL — ABNORMAL HIGH (ref 8–23)
CO2: 19 mmol/L — ABNORMAL LOW (ref 22–32)
Calcium: 9.2 mg/dL (ref 8.9–10.3)
Chloride: 109 mmol/L (ref 98–111)
Creatinine, Ser: 1.83 mg/dL — ABNORMAL HIGH (ref 0.44–1.00)
GFR, Estimated: 27 mL/min — ABNORMAL LOW
Glucose, Bld: 73 mg/dL (ref 70–99)
Potassium: 3.9 mmol/L (ref 3.5–5.1)
Sodium: 141 mmol/L (ref 135–145)
Total Bilirubin: 1 mg/dL (ref 0.0–1.2)
Total Protein: 6.8 g/dL (ref 6.5–8.1)

## 2024-10-02 LAB — CBC WITH DIFFERENTIAL/PLATELET
Abs Immature Granulocytes: 0.06 K/uL (ref 0.00–0.07)
Basophils Absolute: 0.1 K/uL (ref 0.0–0.1)
Basophils Relative: 0 %
Eosinophils Absolute: 0 K/uL (ref 0.0–0.5)
Eosinophils Relative: 0 %
HCT: 31.5 % — ABNORMAL LOW (ref 36.0–46.0)
Hemoglobin: 10.8 g/dL — ABNORMAL LOW (ref 12.0–15.0)
Immature Granulocytes: 1 %
Lymphocytes Relative: 13 %
Lymphs Abs: 1.5 K/uL (ref 0.7–4.0)
MCH: 27.2 pg (ref 26.0–34.0)
MCHC: 34.3 g/dL (ref 30.0–36.0)
MCV: 79.3 fL — ABNORMAL LOW (ref 80.0–100.0)
Monocytes Absolute: 1.2 K/uL — ABNORMAL HIGH (ref 0.1–1.0)
Monocytes Relative: 10 %
Neutro Abs: 9.3 K/uL — ABNORMAL HIGH (ref 1.7–7.7)
Neutrophils Relative %: 76 %
Platelets: 225 K/uL (ref 150–400)
RBC: 3.97 MIL/uL (ref 3.87–5.11)
RDW: 14.5 % (ref 11.5–15.5)
WBC: 12.1 K/uL — ABNORMAL HIGH (ref 4.0–10.5)
nRBC: 0 % (ref 0.0–0.2)

## 2024-10-02 LAB — BASIC METABOLIC PANEL WITH GFR
Anion gap: 15 (ref 5–15)
BUN: 22 mg/dL (ref 8–23)
CO2: 16 mmol/L — ABNORMAL LOW (ref 22–32)
Calcium: 8.2 mg/dL — ABNORMAL LOW (ref 8.9–10.3)
Chloride: 108 mmol/L (ref 98–111)
Creatinine, Ser: 1.38 mg/dL — ABNORMAL HIGH (ref 0.44–1.00)
GFR, Estimated: 38 mL/min — ABNORMAL LOW
Glucose, Bld: 63 mg/dL — ABNORMAL LOW (ref 70–99)
Potassium: 4 mmol/L (ref 3.5–5.1)
Sodium: 139 mmol/L (ref 135–145)

## 2024-10-02 LAB — CBC
HCT: 22.6 % — ABNORMAL LOW (ref 36.0–46.0)
Hemoglobin: 7.6 g/dL — ABNORMAL LOW (ref 12.0–15.0)
MCH: 27.4 pg (ref 26.0–34.0)
MCHC: 33.6 g/dL (ref 30.0–36.0)
MCV: 81.6 fL (ref 80.0–100.0)
Platelets: 157 K/uL (ref 150–400)
RBC: 2.77 MIL/uL — ABNORMAL LOW (ref 3.87–5.11)
RDW: 14.6 % (ref 11.5–15.5)
WBC: 9.5 K/uL (ref 4.0–10.5)
nRBC: 0 % (ref 0.0–0.2)

## 2024-10-02 LAB — GLUCOSE, CAPILLARY
Glucose-Capillary: 64 mg/dL — ABNORMAL LOW (ref 70–99)
Glucose-Capillary: 77 mg/dL (ref 70–99)
Glucose-Capillary: 91 mg/dL (ref 70–99)
Glucose-Capillary: 143 mg/dL — ABNORMAL HIGH (ref 70–99)
Glucose-Capillary: 104 mg/dL — ABNORMAL HIGH (ref 70–99)

## 2024-10-02 LAB — HEMOGLOBIN A1C
Hgb A1c MFr Bld: 6.6 % — ABNORMAL HIGH (ref 4.8–5.6)
Hgb A1c MFr Bld: 6.6 % — ABNORMAL HIGH (ref 4.8–5.6)
Mean Plasma Glucose: 142.72 mg/dL
Mean Plasma Glucose: 142.72 mg/dL

## 2024-10-02 NOTE — Progress Notes (Signed)
 " PROGRESS NOTE    Catherine Leon  FMW:[id number] DOB: 01/21/1941 DOA: 10/01/2024 PCP: Clarice Nottingham, MD    Brief Narrative:  84 year old with hypertension, type 2 diabetes on oral hypoglycemics, history of breast cancer, hyperlipidemia presented to the ER with cough, congestion body ache and poor oral intake for about 2 weeks.  In the emergency room temperature 101.9, respiratory 19, heart rate 119.  Blood pressure is stable.  She is on room air.  Electrolytes are adequate but creatinine 2.1.,  WBC count 19.6.  COVID, flu and RSV was negative.  Lactic acid is 1.7.  Chest x-ray showed left lower lobe pneumonia.  Patient reported being sick since taking flu shot on December 30.  Admitted due to significant tonsillitis started on IV antibiotics.   Subjective: Patient seen and examined.  Eating breakfast.  No more diarrhea since admission yesterday.  Patient reported congested cough but she thinks is opening up now.  No other overnight events.  Assessment & Plan:   Left lower lobe pneumonia: Agree with admission given severity of symptoms. Antibiotics to treat bacterial pneumonia, patient was started on doxycycline  and ceftriaxone . Flu, RSV and COVID-negative. Start Chest physiotherapy, incentive spirometry, deep breathing exercises, sputum induction, mucolytic's and bronchodilators. Sputum cultures, blood cultures, Legionella and streptococcal antigen.  Negative so far. Supplemental oxygen to keep saturations more than 90%. Mobilize with PT OT.  Acute kidney injury: Likely has CKD stage IIIa.  Recent known creatinine of 1.4 that was about 2 years ago.  Likely due to poor oral intake.  Also at home on furosemide  and Entresto .  Holding furosemide , Entresto  and Farxiga  given AKI. Currently receiving IV fluids. Creatinine 1.8.  Appropriately responding.  Will recheck tomorrow morning.  Diarrhea: Marked leukocytosis and AKI.  C. difficile and GI pathogen panel pending, however no bowel movement  since hospitalization.  If no bowel movement for 24 hours, will cancel the test.  Hyperlipidemia: On statin.  Type 2 diabetes: At home on linagliptin .  Currently on sliding scale insulin .  Nonischemic cardiomyopathy: With dehydration.  Holding GDMT.  Essential hypertension blood pressure fairly stable now.  Holding GDMT until clinical improvement of renal functions.    DVT prophylaxis: heparin  injection 5,000 Units Start: 10/01/24 2200 Place TED hose Start: 10/01/24 1634   Code Status: Full code Family Communication: None at the bedside Disposition Plan: Status is: Inpatient Remains inpatient appropriate because: Significant symptoms, IV fluids, IV antibiotics     Consultants:  None  Procedures:  None  Antimicrobials:  Ceftriaxone  and doxycycline  1/16---     Objective: Vitals:   10/01/24 2345 10/02/24 0432 10/02/24 0756 10/02/24 0758  BP: 117/65 (!) 102/53 115/63   Pulse: 91 70 87   Resp: 19 18 18    Temp: (!) 101.3 F (38.5 C) 98.3 F (36.8 C) 97.8 F (36.6 C)   TempSrc: Oral Oral Oral   SpO2: 99% 99% 91% 95%  Weight:      Height:        Intake/Output Summary (Last 24 hours) at 10/02/2024 1146 Last data filed at 10/02/2024 0900 Gross per 24 hour  Intake 480 ml  Output --  Net 480 ml   Filed Weights   10/01/24 1313  Weight: 59.4 kg    Examination:  General: Looks fairly comfortable. Cardiovascular: S1-S2 normal.  Flow murmur present. Respiratory: Bilateral clear.  Some upper airway sounds.SpO2: 95 % on room air. Gastrointestinal: Soft.  Nontender.  Bowel sound present. Ext: No swelling or edema.  No cyanosis. Neuro: Alert awake  and oriented.    Data Reviewed: I have personally reviewed following labs and imaging studies  CBC: Recent Labs  Lab 10/01/24 1322 10/02/24 0821  WBC 19.6* 12.1*  NEUTROABS 18.0* 9.3*  HGB 11.8* 10.8*  HCT 34.3* 31.5*  MCV 79.0* 79.3*  PLT 236 225   Basic Metabolic Panel: Recent Labs  Lab 10/01/24 1322  10/02/24 0821  NA 136 141  K 3.9 3.9  CL 102 109  CO2 21* 19*  GLUCOSE 132* 73  BUN 35* 28*  CREATININE 2.10* 1.83*  CALCIUM  9.6 9.2   GFR: Estimated Creatinine Clearance: 20.2 mL/min (A) (by C-G formula based on SCr of 1.83 mg/dL (H)). Liver Function Tests: Recent Labs  Lab 10/01/24 1322 10/02/24 0821  AST 47* 52*  ALT 38 36  ALKPHOS 118 101  BILITOT 1.3* 1.0  PROT 7.7 6.8  ALBUMIN 4.0 3.4*   No results for input(s): LIPASE, AMYLASE in the last 168 hours. No results for input(s): AMMONIA in the last 168 hours. Coagulation Profile: No results for input(s): INR, PROTIME in the last 168 hours. Cardiac Enzymes: No results for input(s): CKTOTAL, CKMB, CKMBINDEX, TROPONINI in the last 168 hours. BNP (last 3 results) No results for input(s): PROBNP in the last 8760 hours. HbA1C: Recent Labs    10/01/24 1320  HGBA1C 6.6*   CBG: Recent Labs  Lab 10/01/24 2342 10/02/24 0751 10/02/24 0926  GLUCAP 86 64* 77   Lipid Profile: No results for input(s): CHOL, HDL, LDLCALC, TRIG, CHOLHDL, LDLDIRECT in the last 72 hours. Thyroid  Function Tests: No results for input(s): TSH, T4TOTAL, FREET4, T3FREE, THYROIDAB in the last 72 hours. Anemia Panel: No results for input(s): VITAMINB12, FOLATE, FERRITIN, TIBC, IRON, RETICCTPCT in the last 72 hours. Sepsis Labs: Recent Labs  Lab 10/01/24 1322  LATICACIDVEN 1.7    Recent Results (from the past 240 hours)  Resp panel by RT-PCR (RSV, Flu A&B, Covid) Anterior Nasal Swab     Status: None   Collection Time: 10/01/24  1:22 PM   Specimen: Anterior Nasal Swab  Result Value Ref Range Status   SARS Coronavirus 2 by RT PCR NEGATIVE NEGATIVE Final    Comment: (NOTE) SARS-CoV-2 target nucleic acids are NOT DETECTED.  The SARS-CoV-2 RNA is generally detectable in upper respiratory specimens during the acute phase of infection. The lowest concentration of SARS-CoV-2 viral copies this  assay can detect is 138 copies/mL. A negative result does not preclude SARS-Cov-2 infection and should not be used as the sole basis for treatment or other patient management decisions. A negative result may occur with  improper specimen collection/handling, submission of specimen other than nasopharyngeal swab, presence of viral mutation(s) within the areas targeted by this assay, and inadequate number of viral copies(<138 copies/mL). A negative result must be combined with clinical observations, patient history, and epidemiological information. The expected result is Negative.  Fact Sheet for Patients:  bloggercourse.com  Fact Sheet for Healthcare Providers:  seriousbroker.it  This test is no t yet approved or cleared by the [place]  FDA and  has been authorized for detection and/or diagnosis of SARS-CoV-2 by FDA under an Emergency Use Authorization (EUA). This EUA will remain  in effect (meaning this test can be used) for the duration of the COVID-19 declaration under Section 564(b)(1) of the Act, 21 U.S.C.section 360bbb-3(b)(1), unless the authorization is terminated  or revoked sooner.       Influenza A by PCR NEGATIVE NEGATIVE Final   Influenza B by PCR NEGATIVE NEGATIVE Final  Comment: (NOTE) The Xpert Xpress SARS-CoV-2/FLU/RSV plus assay is intended as an aid in the diagnosis of influenza from Nasopharyngeal swab specimens and should not be used as a sole basis for treatment. Nasal washings and aspirates are unacceptable for Xpert Xpress SARS-CoV-2/FLU/RSV testing.  Fact Sheet for Patients: bloggercourse.com  Fact Sheet for Healthcare Providers: seriousbroker.it  This test is not yet approved or cleared by the [place]  FDA and has been authorized for detection and/or diagnosis of SARS-CoV-2 by FDA under an Emergency Use Authorization (EUA). This EUA will  remain in effect (meaning this test can be used) for the duration of the COVID-19 declaration under Section 564(b)(1) of the Act, 21 U.S.C. section 360bbb-3(b)(1), unless the authorization is terminated or revoked.     Resp Syncytial Virus by PCR NEGATIVE NEGATIVE Final    Comment: (NOTE) Fact Sheet for Patients: bloggercourse.com  Fact Sheet for Healthcare Providers: seriousbroker.it  This test is not yet approved or cleared by the [place]  FDA and has been authorized for detection and/or diagnosis of SARS-CoV-2 by FDA under an Emergency Use Authorization (EUA). This EUA will remain in effect (meaning this test can be used) for the duration of the COVID-19 declaration under Section 564(b)(1) of the Act, 21 U.S.C. section 360bbb-3(b)(1), unless the authorization is terminated or revoked.  Performed at [facility], [address], [place], [place] [address]          Radiology Studies: DG Chest 2 View Result Date: 10/01/2024 CLINICAL DATA:  Cough EXAM: CHEST - 2 VIEW COMPARISON:  April 18, 2022 FINDINGS: Stable cardiomediastinal silhouette. Mildly increased left basilar opacity is noted concerning for pneumonia or atelectasis. Right lung is unremarkable. Bony thorax is unremarkable. IMPRESSION: Mildly increased left basilar opacity is noted concerning for pneumonia or atelectasis. Electronically Signed   By: Lynwood Landy Raddle M.D.   On: 10/01/2024 14:19        Scheduled Meds:  heparin   5,000 Units Subcutaneous BID   insulin  aspart  0-5 Units Subcutaneous QHS   insulin  aspart  0-9 Units Subcutaneous TID WC   rosuvastatin   10 mg Oral Daily   Continuous Infusions:  cefTRIAXone  (ROCEPHIN )  IV 2 g (10/02/24 1119)   doxycycline  (VIBRAMYCIN ) IV 100 mg (10/02/24 0834)   lactated ringers  125 mL/hr at 10/01/24 1816     LOS: 1 day    Time spent: 40 minutes    Renato Applebaum, MD [facility]   "

## 2024-10-02 NOTE — Plan of Care (Signed)

## 2024-10-03 LAB — CBC WITH DIFFERENTIAL/PLATELET
Abs Immature Granulocytes: 0.05 K/uL (ref 0.00–0.07)
Basophils Absolute: 0 K/uL (ref 0.0–0.1)
Basophils Relative: 0 %
Eosinophils Absolute: 0.1 K/uL (ref 0.0–0.5)
Eosinophils Relative: 1 %
HCT: 26.6 % — ABNORMAL LOW (ref 36.0–46.0)
Hemoglobin: 9 g/dL — ABNORMAL LOW (ref 12.0–15.0)
Immature Granulocytes: 1 %
Lymphocytes Relative: 16 %
Lymphs Abs: 1.3 K/uL (ref 0.7–4.0)
MCH: 26.9 pg (ref 26.0–34.0)
MCHC: 33.8 g/dL (ref 30.0–36.0)
MCV: 79.4 fL — ABNORMAL LOW (ref 80.0–100.0)
Monocytes Absolute: 0.9 K/uL (ref 0.1–1.0)
Monocytes Relative: 11 %
Neutro Abs: 5.9 K/uL (ref 1.7–7.7)
Neutrophils Relative %: 71 %
Platelets: 211 K/uL (ref 150–400)
RBC: 3.35 MIL/uL — ABNORMAL LOW (ref 3.87–5.11)
RDW: 14.5 % (ref 11.5–15.5)
WBC: 8.2 K/uL (ref 4.0–10.5)
nRBC: 0 % (ref 0.0–0.2)

## 2024-10-03 LAB — BASIC METABOLIC PANEL WITH GFR
Anion gap: 9 (ref 5–15)
BUN: 21 mg/dL (ref 8–23)
CO2: 21 mmol/L — ABNORMAL LOW (ref 22–32)
Calcium: 8.7 mg/dL — ABNORMAL LOW (ref 8.9–10.3)
Chloride: 111 mmol/L (ref 98–111)
Creatinine, Ser: 1.47 mg/dL — ABNORMAL HIGH (ref 0.44–1.00)
GFR, Estimated: 35 mL/min — ABNORMAL LOW
Glucose, Bld: 105 mg/dL — ABNORMAL HIGH (ref 70–99)
Potassium: 3.6 mmol/L (ref 3.5–5.1)
Sodium: 140 mmol/L (ref 135–145)

## 2024-10-03 LAB — GLUCOSE, CAPILLARY
Glucose-Capillary: 91 mg/dL (ref 70–99)
Glucose-Capillary: 82 mg/dL (ref 70–99)

## 2024-10-03 MED ORDER — DOXYCYCLINE HYCLATE 100 MG PO TABS
100.0000 mg | ORAL_TABLET | Freq: Two times a day (BID) | ORAL | Status: DC
  Administered 2024-10-03 – 2024-10-05 (×4): 100 mg via ORAL
  Filled 2024-10-03 (×6): qty 1

## 2024-10-03 MED ORDER — HYDROCOD POLI-CHLORPHE POLI ER 10-8 MG/5ML PO SUER
5.0000 mL | Freq: Two times a day (BID) | ORAL | Status: DC | PRN
  Administered 2024-10-03 – 2024-10-04 (×2): 5 mL via ORAL
  Filled 2024-10-03 (×6): qty 5

## 2024-10-03 NOTE — Progress Notes (Signed)
 " PROGRESS NOTE    Catherine Leon  FMW:[id number] DOB: [birth year] DOA: 10/01/2024 PCP: Clarice Nottingham, MD    Brief Narrative:  84 year old with hypertension, type 2 diabetes on oral hypoglycemics, history of breast cancer, hyperlipidemia presented to the ER with cough, congestion body ache and poor oral intake for about 2 weeks.  In the emergency room temperature 101.9, respiratory 19, heart rate 119.  Blood pressure is stable.  She is on room air.  Electrolytes are adequate but creatinine 2.1.,  WBC count 19.6.  COVID, flu and RSV was negative.  Lactic acid is 1.7.  Chest x-ray showed left lower lobe pneumonia.  Patient reported being sick since taking flu shot on December 30.  Admitted due to significant tonsillitis started on IV antibiotics.   Subjective:  Patient seen and examined.  No overnight fever.  She has some dry cough.  No more diarrhea present.  Patient has agreed with hospital at home program.  She will receive antibiotics today and can be transitioned with monitoring at home.  Assessment & Plan:   Left lower lobe pneumonia: patient is currently on doxycycline  and ceftriaxone . Flu, RSV and COVID-negative. Blood cultures negative. Sputum, Legionella and Streptococcus not available. Already clinically responding to treatment. Continue antibiotics.  Chest physiotherapy and incentive. PT and OT.  Acute kidney injury: Likely has CKD stage IIIa.  Recent known creatinine of 1.4 that was about 2 years ago.  Likely due to poor oral intake.  Also at home on furosemide  and Entresto .  Holding furosemide , Entresto  and Farxiga  given AKI. 1/18, creatinine 1.47 at about her baseline.  Will resume her medications 1 at a time.  Diarrhea: Marked leukocytosis and AKI.  C. difficile and GI pathogen panel was ordered.  She does not have any more diarrhea.  Discontinue isolation precautions.    Hyperlipidemia: On statin.  Type 2 diabetes: At home on linagliptin .  Currently on sliding scale  insulin .  Can resume linagliptin .  Nonischemic cardiomyopathy: With dehydration.  Holding GDMT.  Clinically improving.  Can resume 1 at a time.  Essential hypertension blood pressure fairly stable now.  Holding GDMT until clinical improvement of renal functions.  Patient is clinically improving.  She has adequate support system at home.  She is appropriate candidate for hospital at home.    DVT prophylaxis: heparin  injection 5,000 Units Start: 10/01/24 2200 Place TED hose Start: 10/01/24 1634   Code Status: Full code Family Communication: None at the bedside Disposition Plan: Status is: Inpatient Remains inpatient appropriate because: Significant symptoms, IV antibiotics.     Consultants:  None  Procedures:  None  Antimicrobials:  Ceftriaxone  and doxycycline  1/16---     Objective: Vitals:   10/02/24 0758 10/02/24 1248 10/02/24 2102 10/03/24 0514  BP:  98/64 (!) 111/56 109/64  Pulse:  69 82 76  Resp:  18 20 18   Temp:  97.7 F (36.5 C) 99 F (37.2 C) 98.9 F (37.2 C)  TempSrc:  Oral Oral Oral  SpO2: 95% (!) 87% 100% 100%  Weight:      Height:        Intake/Output Summary (Last 24 hours) at 10/03/2024 1058 Last data filed at 10/03/2024 0328 Gross per 24 hour  Intake 1380.2 ml  Output --  Net 1380.2 ml   Filed Weights   10/01/24 1313  Weight: 59.4 kg    Examination:  General: Looks fairly comfortable.  Pleasant interaction.  On room air. Cardiovascular: S1-S2 normal.  Flow murmur present. Respiratory: Bilateral clear.  Some  upper airway sounds.SpO2: 100 % on room air. Gastrointestinal: Soft.  Nontender.  Bowel sound present. Ext: No swelling or edema.  No cyanosis. Neuro: Alert awake and oriented.    Data Reviewed: I have personally reviewed following labs and imaging studies  CBC: Recent Labs  Lab 10/01/24 1322 10/02/24 0821 10/03/24 0555  WBC 19.6* 12.1* 8.2  NEUTROABS 18.0* 9.3* 5.9  HGB 11.8* 10.8* 9.0*  HCT 34.3* 31.5* 26.6*  MCV 79.0*  79.3* 79.4*  PLT 236 225 211   Basic Metabolic Panel: Recent Labs  Lab 10/01/24 1322 10/02/24 0821 10/03/24 0555  NA 136 141 140  K 3.9 3.9 3.6  CL 102 109 111  CO2 21* 19* 21*  GLUCOSE 132* 73 105*  BUN 35* 28* 21  CREATININE 2.10* 1.83* 1.47*  CALCIUM  9.6 9.2 8.7*   GFR: Estimated Creatinine Clearance: 25.1 mL/min (A) (by C-G formula based on SCr of 1.47 mg/dL (H)). Liver Function Tests: Recent Labs  Lab 10/01/24 1322 10/02/24 0821  AST 47* 52*  ALT 38 36  ALKPHOS 118 101  BILITOT 1.3* 1.0  PROT 7.7 6.8  ALBUMIN 4.0 3.4*   No results for input(s): LIPASE, AMYLASE in the last 168 hours. No results for input(s): AMMONIA in the last 168 hours. Coagulation Profile: No results for input(s): INR, PROTIME in the last 168 hours. Cardiac Enzymes: No results for input(s): CKTOTAL, CKMB, CKMBINDEX, TROPONINI in the last 168 hours. BNP (last 3 results) No results for input(s): PROBNP in the last 8760 hours. HbA1C: Recent Labs    10/01/24 1320  HGBA1C 6.6*   CBG: Recent Labs  Lab 10/02/24 0926 10/02/24 1245 10/02/24 1658 10/02/24 2056 10/03/24 0746  GLUCAP 77 91 143* 104* 91   Lipid Profile: No results for input(s): CHOL, HDL, LDLCALC, TRIG, CHOLHDL, LDLDIRECT in the last 72 hours. Thyroid  Function Tests: No results for input(s): TSH, T4TOTAL, FREET4, T3FREE, THYROIDAB in the last 72 hours. Anemia Panel: No results for input(s): VITAMINB12, FOLATE, FERRITIN, TIBC, IRON, RETICCTPCT in the last 72 hours. Sepsis Labs: Recent Labs  Lab 10/01/24 1322  LATICACIDVEN 1.7    Recent Results (from the past 240 hours)  Resp panel by RT-PCR (RSV, Flu A&B, Covid) Anterior Nasal Swab     Status: None   Collection Time: 10/01/24  1:22 PM   Specimen: Anterior Nasal Swab  Result Value Ref Range Status   SARS Coronavirus 2 by RT PCR NEGATIVE NEGATIVE Final    Comment: (NOTE) SARS-CoV-2 target nucleic acids are NOT  DETECTED.  The SARS-CoV-2 RNA is generally detectable in upper respiratory specimens during the acute phase of infection. The lowest concentration of SARS-CoV-2 viral copies this assay can detect is 138 copies/mL. A negative result does not preclude SARS-Cov-2 infection and should not be used as the sole basis for treatment or other patient management decisions. A negative result may occur with  improper specimen collection/handling, submission of specimen other than nasopharyngeal swab, presence of viral mutation(s) within the areas targeted by this assay, and inadequate number of viral copies(<138 copies/mL). A negative result must be combined with clinical observations, patient history, and epidemiological information. The expected result is Negative.  Fact Sheet for Patients:  bloggercourse.com  Fact Sheet for Healthcare Providers:  seriousbroker.it  This test is no t yet approved or cleared by the [place]  FDA and  has been authorized for detection and/or diagnosis of SARS-CoV-2 by FDA under an Emergency Use Authorization (EUA). This EUA will remain  in effect (meaning this test can  be used) for the duration of the COVID-19 declaration under Section 564(b)(1) of the Act, 21 U.S.C.section 360bbb-3(b)(1), unless the authorization is terminated  or revoked sooner.       Influenza A by PCR NEGATIVE NEGATIVE Final   Influenza B by PCR NEGATIVE NEGATIVE Final    Comment: (NOTE) The Xpert Xpress SARS-CoV-2/FLU/RSV plus assay is intended as an aid in the diagnosis of influenza from Nasopharyngeal swab specimens and should not be used as a sole basis for treatment. Nasal washings and aspirates are unacceptable for Xpert Xpress SARS-CoV-2/FLU/RSV testing.  Fact Sheet for Patients: bloggercourse.com  Fact Sheet for Healthcare Providers: seriousbroker.it  This test is not yet  approved or cleared by the [place]  FDA and has been authorized for detection and/or diagnosis of SARS-CoV-2 by FDA under an Emergency Use Authorization (EUA). This EUA will remain in effect (meaning this test can be used) for the duration of the COVID-19 declaration under Section 564(b)(1) of the Act, 21 U.S.C. section 360bbb-3(b)(1), unless the authorization is terminated or revoked.     Resp Syncytial Virus by PCR NEGATIVE NEGATIVE Final    Comment: (NOTE) Fact Sheet for Patients: bloggercourse.com  Fact Sheet for Healthcare Providers: seriousbroker.it  This test is not yet approved or cleared by the [place]  FDA and has been authorized for detection and/or diagnosis of SARS-CoV-2 by FDA under an Emergency Use Authorization (EUA). This EUA will remain in effect (meaning this test can be used) for the duration of the COVID-19 declaration under Section 564(b)(1) of the Act, 21 U.S.C. section 360bbb-3(b)(1), unless the authorization is terminated or revoked.  Performed at [facility], [address] [place], [place] [address]          Radiology Studies: DG Chest 2 View Result Date: 10/01/2024 CLINICAL DATA:  Cough EXAM: CHEST - 2 VIEW COMPARISON:  April 18, 2022 FINDINGS: Stable cardiomediastinal silhouette. Mildly increased left basilar opacity is noted concerning for pneumonia or atelectasis. Right lung is unremarkable. Bony thorax is unremarkable. IMPRESSION: Mildly increased left basilar opacity is noted concerning for pneumonia or atelectasis. Electronically Signed   By: Lynwood Landy Raddle M.D.   On: 10/01/2024 14:19        Scheduled Meds:  heparin   5,000 Units Subcutaneous BID   insulin  aspart  0-5 Units Subcutaneous QHS   insulin  aspart  0-9 Units Subcutaneous TID WC   rosuvastatin   10 mg Oral Daily   Continuous Infusions:  cefTRIAXone  (ROCEPHIN )  IV 2 g (10/02/24 1119)   doxycycline   (VIBRAMYCIN ) IV 100 mg (10/03/24 0805)     LOS: 2 days    Time spent: 40 minutes    Renato Applebaum, MD [facility]   "

## 2024-10-03 NOTE — Plan of Care (Signed)

## 2024-10-03 NOTE — Evaluation (Signed)
 Physical Therapy Evaluation Patient Details Name: Catherine Leon MRN: [id number] DOB: 12-14-1940 Today's Date: 10/03/2024  History of Present Illness  84 year old presented to the ER with cough, congestion body ache and poor oral intake. admitted with pna. PMH: hypertension, type 2 diabetes on oral hypoglycemics, history of breast cancer, hyperlipidemia  Clinical Impression  Patient evaluated by Physical Therapy with no further acute PT needs identified. All education has been completed and the patient has no further questions.  Pt agreeable to PT, motivated to d/c home if possible today. Pt is very independent and active at baseline, typically community amb without device.  Pt is able to ambulate ~ 130' with IV pole push initially, fading to no device after ~ 20', pt able to continue without support. Slight drifting noted however no overt LOB during PT session. Unable to get pleth d/t nail polish, pt denies dyspnea post activity Pt overall at supervision/ mod I level for functional tasks and would be appropriate for H@H  from PT standpoint. No further needs at this time   See below for any follow-up Physical Therapy or equipment needs. PT is signing off. Thank you for this referral.         If plan is discharge home, recommend the following: Help with stairs or ramp for entrance   Can travel by private vehicle        Equipment Recommendations None recommended by PT  Recommendations for Other Services       Functional Status Assessment Patient has not had a recent decline in their functional status     Precautions / Restrictions Precautions Precautions: Fall Restrictions Weight Bearing Restrictions Per Provider Order: No      Mobility  Bed Mobility               General bed mobility comments: pt OOB without assistance on PT arrival    Transfers Overall transfer level: Modified independent Equipment used: None               General transfer comment: incr  time, no physical assist    Ambulation/Gait Ambulation/Gait assistance: Supervision Gait Distance (Feet): 130 Feet Assistive device: None, IV Pole Gait Pattern/deviations: Step-through pattern, Decreased stride length, Drifts right/left       General Gait Details: supervision for safety. initial support of IV pole, support removed by PT and pt able to continue without UE support. pt intermittently unsteady, slight drifting noted however no over LOB. unable to get pleth d/t pt nail polish. pt denies dyspnea  Stairs            Wheelchair Mobility     Tilt Bed    Modified Rankin (Stroke Patients Only)       Balance Overall balance assessment: Needs assistance Sitting-balance support: Feet supported, No upper extremity supported Sitting balance-Leahy Scale: Good     Standing balance support: During functional activity, No upper extremity supported, Single extremity supported Standing balance-Leahy Scale: Fair Standing balance comment: Fair +/NT to moderate challenges, tolerated min challenges, negotiates obstacles in hallway             High level balance activites: Side stepping, Head turns, Turns High Level Balance Comments: no overt LOB, supervision for safety for above             Pertinent Vitals/Pain Pain Assessment Pain Assessment: No/denies pain    Home Living Family/patient expects to be discharged to:: Private residence Living Arrangements: Alone Available Help at Discharge: Family;Available PRN/intermittently Type of Home: House Home Access:  Stairs to enter   [employer] of Steps: 1   Home Layout: One level Home Equipment: [employer] (2 wheels);Cane - single point;BSC/3in1 Additional Comments: equipment belonged to pt's husband; pt reports son can assist at home    Prior Function Prior Level of Function : Independent/Modified Independent;Driving             Mobility Comments: very active at baseline, ind - community  ambulator ADLs Comments: ind ADLs, iADLS     Extremity/Trunk Assessment   Upper Extremity Assessment Upper Extremity Assessment: Overall WFL for tasks assessed    Lower Extremity Assessment Lower Extremity Assessment: Overall WFL for tasks assessed    Cervical / Trunk Assessment Cervical / Trunk Assessment: Normal  Communication   Communication Communication: No apparent difficulties    Cognition Arousal: Alert Behavior During Therapy: WFL for tasks assessed/performed   PT - Cognitive impairments: No apparent impairments                         Following commands: Intact       Cueing Cueing Techniques: Verbal cues     General Comments General comments (skin integrity, edema, etc.): simulated single step for home entry. pt with LE strength and activity tol adequate to ascend single step at home    Exercises     Assessment/Plan    PT Assessment Patient does not need any further PT services  PT Problem List         PT Treatment Interventions      PT Goals (Current goals can be found in the Care Plan section)  Acute Rehab PT Goals Patient Stated Goal: go home soon PT Goal Formulation: All assessment and education complete, DC therapy    Frequency       Co-evaluation               AM-PAC PT 6 Clicks Mobility  Outcome Measure Help needed turning from your back to your side while in a flat bed without using bedrails?: None Help needed moving from lying on your back to sitting on the side of a flat bed without using bedrails?: None Help needed moving to and from a bed to a chair (including a wheelchair)?: None Help needed standing up from a chair using your arms (e.g., wheelchair or bedside chair)?: None Help needed to walk in hospital room?: None Help needed climbing 3-5 steps with a railing? : A Little 6 Click Score: 23    End of Session   Activity Tolerance: Patient tolerated treatment well Patient left: with call bell/phone within  reach;in chair   PT Visit Diagnosis: Other abnormalities of gait and mobility (R26.89)    Time: 8894-8878 PT Time Calculation (min) (ACUTE ONLY): 16 min   Charges:   PT Evaluation $PT Eval Low Complexity: 1 Low   PT General Charges $$ ACUTE PT VISIT: 1 Visit         Lehi Phifer, PT  Acute Rehab Dept [facility]) [phone]  10/03/2024   [facility] 10/03/2024, 11:29 AM

## 2024-10-03 NOTE — Progress Notes (Signed)
 Arrived to patient    she was brought home and admitted to out program   she is alert and oriented   she is mobile without any assistance   all vitals are stable   it takes a while to get a good reading of her O2 sats  everything is explained to her about our program   she is showed how to operate the equipment   she is showed the medication bins for anytime she may need something when she is here alone  our number was programmed into her phone incase she isnt near the ipad   she is aldo taught how to use it if she is here in her living room   she is advised to call if anything is needed at all

## 2024-10-03 NOTE — Progress Notes (Signed)
 Phone call completed with patient. Introduced myself as the [facility]DRAISER for the night and verbalized that I will be available via phone or tablet call if needed. Patient stated that she would be getting ready for bed soon. Plan made for patient to call RN on the tablet when she is ready to take her cough syrup and antibiotic. All questions answered prior to ending call with patient.

## 2024-10-03 NOTE — Care Management (Signed)
" °  Transition of Care (TOC) Screening Note   Patient Details  Name: Ronal DELENA Sole Date of Birth: 08/17/1941   Transition of Care [facility]) CM/SW Contact:    Corean JAYSON Canary, RN Phone Number: 10/03/2024, 4:11 PM    Transition of Care Department [facility]) has reviewed patient and no TOC needs have been identified at this time. We will continue to monitor patient advancement through interdisciplinary progression rounds. If new patient transition needs arise, please place a TOC consult.   "

## 2024-10-03 NOTE — Plan of Care (Signed)
 " Hospital at Home Interim Note     LY WASS   FMW:[id number]  DOB: 1941/06/25  DOA: 10/01/2024     2 Date of Service: 10/03/2024   HPI: [name] is an 84 year old female with history of hypertension, non-insulin -dependent diabetes mellitus, history of breast cancer, hyperlipidemia presenting w/ CAP. he presents to ED for chief concerns of cough, congestion, body aches, poor p.o. intake for approximately 2 weeks. She reports that she got the flu shot on December 30 and she has been feeling sick since then's.  She reports she had generalized body aches, cough, congestion, chills at home.  Her cough was initially thicker yellow sputum and then has transitioned to a light yellow sputum and/or white in color.  She reports she did not have fever at home. She denies known sick contacts.  She denies nausea, vomiting, blood in her urine, blood in her stool, syncope, loss of consciousness. She reports she did develop diarrhea a couple of days ago.  She reports she does drink water and black tea but she has not had any appetite.  She just does not feel like eating. Presented to the ER Tmax of 101.9, respiration rate 19, heart rate 119, blood pressure 150/74, SpO2 100% on room air. Serum sodium is 136, potassium 3.9, chloride 102, bicarb 21, BUN of 36, serum creatinine of 2.10, eGFR 23, nonfasting blood glucose 132, WBC 19.6, hemoglobin 11.8, platelets of 236. Influenza A/influenza B/RSV/COVID PCR were negative.  Lactic acid 1.7.  Had lengthy discussion with patient as well as son (over the phone).Pt as well as son in agreement with the hospital at home program.  Subjective:  At present, pt reports mild improvement in SOB. Still with recurrent cough. No reported CP, abdominal pain, nausea or vomiting.   Hospital Problems Assessment and Plan: * Community acquired pneumonia + SOB and malaise x 2 weeks on presentation  Left lower lobe pneumonia Continue with doxycycline  100 mg po BID and ceftriaxone   2 g IV daily to complete a minimum of 5-day course (day 3/5)  DuoNebs as needed for wheezing and shortness of breath Check MRSA PCR, Legionella Incentive spirometry, flutter valve  Acute on chronic kidney disease stage 3 Initial serum Cr 2.1 with baseline Cr around 1.1-1.4 Initially felt to be prerenal  Cr now 1.4- near baseline  Continue to hold furosemide , and entresto , AM team to resume when benefits outweigh risk Continue to trend renal function    Diarrhea Initial complaints of watery diarrhea prior to presentation  Infectious workup including stool panel and c diff ordered- howerver, no diarrhea since admisions  Dcing  enteric precautions    Leukocytosis Suspect secondary to left lower lobe pneumonia however in setting of diarrhea with markedly elevated leukocytosis, C. difficile/infectious diarrhea cannot be excluded at this time Recheck CBC in the a.m.  Hyperlipidemia Home rosuvastatin  10 mg daily resumed  Cough Robitussin DM as needed for cough during the day and to loosen phlegm Tussionex nightly as needed for coughing at night Incentive spirometry, flutter valve  Diabetes mellitus type 2, noninsulin dependent (HCC) A1C 6.6  Monitor blood sugars  BID blood sugar checks  Start glipizide vs long acting insulin  as clinically indicated    Nonischemic cardiomyopathy (HCC) Strict I's and O's Does not appear to be in acute exacerbation at this time  Essential hypertension Home Entresto  and furosemide  not resumed on admission in setting of acute kidney injury Will add on low dose norvasc ( with parameters) as a bridge  Resume home regimen as clinically indicated         Objective Vital signs were reviewed and unremarkable. Vitals:   10/02/24 0758 10/02/24 1248 10/02/24 2102 10/03/24 0514  BP:  98/64 (!) 111/56 109/64  Pulse:  69 82 76  Temp:  97.7 F (36.5 C) 99 F (37.2 C) 98.9 F (37.2 C)  Resp:  18 20 18   Height:      Weight:      SpO2: 95% (!)  87% 100% 100%  TempSrc:  Oral Oral Oral  BMI (Calculated):        Exam Physical Exam Constitutional:      Appearance: She is normal weight.  HENT:     Head: Normocephalic and atraumatic.     Nose: Nose normal.     Mouth/Throat:     Mouth: Mucous membranes are moist.  Eyes:     Pupils: Pupils are equal, round, and reactive to light.  Cardiovascular:     Rate and Rhythm: Normal rate and regular rhythm.  Pulmonary:     Effort: Pulmonary effort is normal.  Abdominal:     General: Bowel sounds are normal.  Musculoskeletal:        General: Normal range of motion.  Skin:    General: Skin is warm.  Neurological:     General: No focal deficit present.  Psychiatric:        Mood and Affect: Mood normal.      Labs / Other Information There are no new results to review at this time.   Hospital at Home Admission Criteria Checklist:  Formal consent explained in detail and signed at the bedside: yes Patient meets inpatient admission criteria (see below for further details) yes Is pt Medicare FFS/[employer] Medicare-Medicaid, [employer], [employer] Medicare, [employer]age, [employer] ( required for initial launch with plan to expand)? yes Lives within 25 mil/ 30 min from [facility] within [place](pt may stay with family member during admission who lives within 25 miles or 30 min from [facility]in [facility])? yes Hemodynamically stable with relatively low risk of clinical deterioration-not requiring ICU? yes Age >18? yes Does not require frequent touch-points or complex interventions/medications (ie Titrated Infusions (IV insulin , heparin  drips, vasoactive drips, use of infused or injectable controlled substances, patients on insulin )? no Any Behavioral Health comorbidities likely to increase risk for in-home care (ie Acute delirium or experiencing a marked altered mental status and cause is not a treatable condition in the home)? no Has the patient been on BIPAP during course of ED  evaluation or hospitalization? no IF YES, Has the patient been off of BIPAP for >24 hours(If NO-THEN PATIENT DOES MEET INCLUSION CRITERIA)? not applicable On Room Air or Needs oxygen at home (<6L)? is not on home oxygen therapy. Active safety concerns (ie Unable to use bedside commode independently and lacks caregiver support for safety- needs SNF placement, unable to obtain IV access)? no Has skin check been performed? yes  Has Physical Therapy screened the patient? yes  Common admission diagnoses including: CAP, COPD Exacerbation, Acute on chronic heart failure, Cellulitis, UTI , dehydration, acute resp failure with hypoxia (requiring <5L)   Social Screening:  - Has the family been directly contacted about Hospital at Home program with consent obtained (if yes- please document who was spoken to with name and phone number)? yes  -Was the family approached about the use of TOC pharmacy for medications at discharge? no Any significant ETOH intake? no Does patient smoke? no Does patient understand  that they CANNOT smoke on the hospital at home program or in the presence of oxygen(fire hazard)? not applicable Patient states able to use iPad/phone for communication/has family who is able to use? yes Patient has agreed to be compliant with medication and treatment regimen of the program? yes Any active drug use in patient or primary caregiver including daily dosing of methadone? no Stable home environment ( access to appropriate heating in cold conditions and/or appropriate air conditioning in hot conditions and/or no running water/electricity)? yes  No aggressive pets at home? no Firearm present? no  With ability or willingness to store them unloaded in a locked case for duration of hospitalization? not applicable Ambulatory? yes  no difficulty Bed bugs present on home evaluation? no Family support system in place? yes Patient feels safe at home? no Any domestic violence reported? no Any  actively decompensated behavioral health issues including agitation/aggressive behavior? no  Patient requests food to be provided by hospital home program? no PT/OT eval completed and not requiring SNF, ALF, inpatient rehab? yes  To be admitted to the [facility] at [facility] program, a patient generally must meet the following: 1. Requirement for Inpatient Level of Care: The patient's condition must necessitate an inpatient level of care. This is typically indicated by one or more of the following, depending on their specific diagnosis:  Persistent tachycardia despite appropriate treatment (e.g., for Heart Failure, UTI). Persistent tachypnea (rapid breathing) or dyspnea (shortness of breath) that hasn't improved sufficiently with observation care (e.g., for Heart Failure, Pneumonia, Viral Illness, COVID). Hypoxemia (low oxygen levels), such as a new need for oxygen, an increased need from baseline, or specific oxygen saturation levels (e.g., SpO2 <90-94% depending on the condition) that persist despite observation (e.g., for Heart Failure, COPD, Pneumonia, Viral Illness, COVID). Need for Intravenous (IV) hydration due to an inability to maintain oral hydration, which persists despite observation care (e.g., for Cellulitis, UTI, Viral Illness, COVID). Specific to Heart Failure: Persistent pulmonary edema, indicated by a new oxygen need, lack of improvement with IV diuretics, and ongoing tachypnea/dyspnea. Specific to COPD: A decrease in known baseline resting oxygen saturation (SpO2) by 4% or more, or an increase in pre-existing supplemental oxygen requirements, which persists despite observation and requires continued close monitoring. Specific to Pneumonia: A Pneumonia Severity Index (PSI) class IV (moderate risk). Specific to Cellulitis: Failure of outpatient antibiotic therapy (indicated by progression or no improvement after a minimum of 48 hours on an adequate regimen) or a clinical presentation (like  acuity or rapidity of progression) that requires the intensity of monitoring found in an inpatient setting. Specific to UTI: Persistence or worsening of clinical findings like fever, pain, or dehydration despite observation care; presence of significant uropathy; suspected infection of an indwelling prosthetic device, stent, implant, or graft; or pregnancy with suspected pyelonephritis.  2. Appropriateness for Hospital at Home Setting: The patient's overall clinical picture, including the severity of their illness, their care needs, and their medical history and comorbidities, must be suitable for management in the Hospital at Home environment. This essentially means that none of the exclusion criteria (listed below) are met.  Unified Exclusion Criteria for Hospital at Home Admission: A patient would not be eligible for Hospital at Home if any of the following are present: Hemodynamic Instability: Hypotension (low blood pressure) is present. Respiratory Instability or Needs Beyond Program Capability: There is a new need for invasive or noninvasive ventilatory assistance (like BiPAP or a ventilator). Oxygenation is not sufficient, generally indicated if an FiO2 (fraction of  inspired oxygen) of 45% (which is about 6 Liters/minute via nasal cannula) or more is required to keep oxygen saturation (SpO2) at 90% or greater. Monitoring or Procedural Needs Beyond Program Capability: There is a need for invasive monitoring, such as a pulmonary artery catheter or an arterial line. There is a need for immediate-response telemetry monitoring (for dangerous arrhythmia detection and subsequent immediate intervention). The required medication regimen is beyond the capabilities of Hospital at Home (e.g., dosing intervals are too frequent for home administration). There is a need for a procedure that cannot be performed by the [facility] team (e.g., significant wound debridement or abscess drainage for  cellulitis, or percutaneous nephrostomy for a complicated UTI). Significant Organ Dysfunction or Markers of Severe Illness: Mental status is not at baseline, or there is altered mental status suggestive of inadequate perfusion. Renal (kidney) function is unstable or showing an ongoing decline. There is evidence of inadequate perfusion, such as metabolic acidosis or myocardial ischemia. Uncompensated acidosis is present. Condition-Specific Severity or Complications Making Home Care Unsuitable: For Heart Failure: Known severe cardiac valvular disease (e.g., aortic stenosis, mitral regurgitation); or severe peripheral edema that impairs the ability to urinate or ambulate. For COPD: Known concurrent comorbidity or finding that indicates a higher-risk COPD exacerbation (e.g., pulmonary fibrosis, cavitation, pleural effusion, pneumothorax, rib fracture). For Pneumonia: Pneumonia Severity Index (PSI) class V (indicating high risk for inpatient mortality); known concurrent comorbidity or finding that indicates higher-risk pneumonia (e.g., pulmonary fibrosis, cavitation, large or loculated pleural effusion); or a concomitant serious infectious process like endocarditis or empyema. For Cellulitis: Orbital, periorbital, or necrotizing infection is suspected; or a concomitant serious infectious process like endocarditis, septic emboli, or septic joint space infection. For UTI: Urinary tract obstruction (e.g., kidney stone, bladder outlet obstruction); or a concomitant serious infectious process like endocarditis or septic emboli. For Viral Illness & COVID-19: A concomitant serious infectious process like endocarditis or empyema.  General Comorbidities or Status:  The patient is significantly immunosuppressed (this applies to Pneumonia, Cellulitis, UTI, Viral Illness, and COVID-19). The patient meets inpatient admission criteria for a second diagnosis, or has care needs beyond the capabilities of Hospital at  Home due to an active clinically significant comorbidity. (This is a general exclusion across all listed conditions)  Time spent: >60 minutes   [facility] 10/03/2024, 1:35 PM   "

## 2024-10-03 NOTE — Progress Notes (Signed)
 This clinical research associate arrived at [facility], went to [address] to the nursing station, spoke with the secretary, obtained the patients shadow chart and reiterate with her to not discharge her from the hospital census. This writer went to room 1502, found the patient sitting up in her recliner, still receiving IV fluids. This clinical research associate contacted the nurse to disconnect it so this clinical research associate could get her dressed. This clinical research associate introduced herself to the patient.  The nurse complied. The patient was A&O X4, was able to verify her name, birth date, address, phone number and emergency contact. This clinical research associate obtained the patients vital sign: T-97.9, BP-111/62, R-18, P-76, O2 stat-94% on room air. Patient was able to stand, get dressed with minimal help. This clinical research associate helped patient gather her belongings, coat, purse, cell phone. The patient sat in this writers wheelchair, the patients belonging was handed to the patient for safe keeping while transferring her. The patient was wheeled down to the transport [place], loaded into the [place] and secured via seatbelt. The patient was transferred  home.   This clinical research associate assisted patient to her front door, patient unlocked her door and walked into her home without any issues. The patient sat down in her recliner, this writer brought in all of the equipment. This airline pilot up the [facility] equipment. This clinical research associate obtained the patients vital signs, noted in EPIC under the vital sign tab. This clinical research associate went over with the patient how to use the tablet, how to call the nurse. The patient notified the nurse she needed help, nurse Shanda called the patient back and the patient was able to answer the call without any problems. The patient stated she did not have any more questions for this clinical research associate. The team Paramedic, Barnie came in to do patients admission, this writer left the patients home. ----------------------------------------------------------------------------------------- Nat SQUIBB. Franchot,  EMT

## 2024-10-03 NOTE — Progress Notes (Signed)
 OT Cancellation Note  Patient Details Name: Catherine Leon MRN: [id number] DOB: 1941/01/20   Cancelled Treatment:    Reason Eval/Treat Not Completed: OT screened, no needs identified, will sign off.   [name], OTR/L 10/03/2024, 11:42 AM

## 2024-10-03 NOTE — Progress Notes (Signed)
 Patient transferred to the [facility] program. Communicated with patient via video tablet. AAOx4. No complaints of pain or SOB. Room air. Plan of care reviewed with patient. Patient was told that the virtual nurse is available 24/7. [facility] phone number given to the patient. Tablet usage explained. Medication reconciliation completed with Barnie, paramedic.

## 2024-10-03 NOTE — Progress Notes (Signed)
 Virtual call check on Pt. Catherine Leon ambulating reports feeling well. Tussinex 4 syringes a PRN was delivered this evening. Notified second visit is apporaching.

## 2024-10-03 NOTE — Plan of Care (Signed)
" °  Problem: Education: Goal: Ability to describe self-care measures that may prevent or decrease complications (Diabetes Survival Skills Education) will improve Outcome: Progressing   Problem: Coping: Goal: Ability to adjust to condition or change in health will improve Outcome: Progressing   Problem: Fluid Volume: Goal: Ability to maintain a balanced intake and output will improve Outcome: Progressing   Problem: Education: Goal: Knowledge of General Education information will improve Description: Including pain rating scale, medication(s)/side effects and non-pharmacologic comfort measures Outcome: Progressing   Problem: Clinical Measurements: Goal: Diagnostic test results will improve Outcome: Progressing Goal: Respiratory complications will improve Outcome: Progressing   "

## 2024-10-03 NOTE — Progress Notes (Signed)
" °   10/03/24 1100  Chest Physiotherapy Tx  $ Flutter Administration Initial  $ Flutter Green Yes  Post-Treatment Pulse 79  Post-Treatment Respirations 82  Cough Strong  CPT Treatment Tolerance Tolerated well    "

## 2024-10-03 NOTE — Progress Notes (Signed)
 This clinical research associate arrived  at the patients house to deliver medications. The patient met this writer at the door, patient was walking around with no problems. The patient had family over with her. This clinical research associate called the Acupuncturist, Darice to verify medications. The patient stated she was good, had no questions or concerns for this clinical research associate. This clinical research associate reiterated with the patient to call Virtual Nurse if she needed anything. This clinical research associate left the patients house.------ Nat SQUIBB. Franchot, EMT

## 2024-10-03 NOTE — Plan of Care (Signed)
  Problem: Education: Goal: Knowledge of General Education information will improve Description: Including pain rating scale, medication(s)/side effects and non-pharmacologic comfort measures Outcome: Progressing   Problem: Clinical Measurements: Goal: Diagnostic test results will improve Outcome: Progressing Goal: Respiratory complications will improve Outcome: Progressing   Problem: Nutrition: Goal: Adequate nutrition will be maintained Outcome: Progressing   Problem: Safety: Goal: Ability to remain free from injury will improve Outcome: Progressing   

## 2024-10-04 DIAGNOSIS — K5900 Constipation, unspecified: Secondary | ICD-10-CM

## 2024-10-04 LAB — COMPREHENSIVE METABOLIC PANEL WITH GFR
ALT: 32 U/L (ref 0–44)
AST: 33 U/L (ref 15–41)
Albumin: 3.4 g/dL — ABNORMAL LOW (ref 3.5–5.0)
Alkaline Phosphatase: 110 U/L (ref 38–126)
Anion gap: 11 (ref 5–15)
BUN: 12 mg/dL (ref 8–23)
CO2: 22 mmol/L (ref 22–32)
Calcium: 8.8 mg/dL — ABNORMAL LOW (ref 8.9–10.3)
Chloride: 108 mmol/L (ref 98–111)
Creatinine, Ser: 1.23 mg/dL — ABNORMAL HIGH (ref 0.44–1.00)
GFR, Estimated: 43 mL/min — ABNORMAL LOW
Glucose, Bld: 191 mg/dL — ABNORMAL HIGH (ref 70–99)
Potassium: 3.8 mmol/L (ref 3.5–5.1)
Sodium: 141 mmol/L (ref 135–145)
Total Bilirubin: 0.3 mg/dL (ref 0.0–1.2)
Total Protein: 6.6 g/dL (ref 6.5–8.1)

## 2024-10-04 LAB — CBC
HCT: 30.2 % — ABNORMAL LOW (ref 36.0–46.0)
Hemoglobin: 10.2 g/dL — ABNORMAL LOW (ref 12.0–15.0)
MCH: 27.6 pg (ref 26.0–34.0)
MCHC: 33.8 g/dL (ref 30.0–36.0)
MCV: 81.6 fL (ref 80.0–100.0)
Platelets: 250 K/uL (ref 150–400)
RBC: 3.7 MIL/uL — ABNORMAL LOW (ref 3.87–5.11)
RDW: 14.6 % (ref 11.5–15.5)
WBC: 10 K/uL (ref 4.0–10.5)
nRBC: 0 % (ref 0.0–0.2)

## 2024-10-04 MED ORDER — DAPAGLIFLOZIN PROPANEDIOL 10 MG PO TABS
10.0000 mg | ORAL_TABLET | Freq: Every day | ORAL | Status: DC
  Administered 2024-10-05: 10 mg via ORAL
  Filled 2024-10-04 (×2): qty 1

## 2024-10-04 MED ORDER — IPRATROPIUM-ALBUTEROL 0.5-2.5 (3) MG/3ML IN SOLN
3.0000 mL | Freq: Four times a day (QID) | RESPIRATORY_TRACT | Status: DC | PRN
  Administered 2024-10-04 (×2): 3 mL via RESPIRATORY_TRACT
  Filled 2024-10-04 (×10): qty 3

## 2024-10-04 MED ORDER — SENNOSIDES-DOCUSATE SODIUM 8.6-50 MG PO TABS
1.0000 | ORAL_TABLET | Freq: Two times a day (BID) | ORAL | Status: AC
  Administered 2024-10-04 – 2024-10-05 (×2): 1 via ORAL
  Filled 2024-10-04 (×2): qty 1

## 2024-10-04 MED ORDER — SACUBITRIL-VALSARTAN 24-26 MG PO TABS
1.0000 | ORAL_TABLET | Freq: Two times a day (BID) | ORAL | Status: DC
  Administered 2024-10-05: 1 via ORAL
  Filled 2024-10-04 (×3): qty 1

## 2024-10-04 MED ORDER — POLYETHYLENE GLYCOL 3350 17 G PO PACK
17.0000 g | PACK | Freq: Once | ORAL | Status: AC
  Administered 2024-10-04: 17 g via ORAL
  Filled 2024-10-04: qty 1

## 2024-10-04 MED ORDER — SENNOSIDES-DOCUSATE SODIUM 8.6-50 MG PO TABS
1.0000 | ORAL_TABLET | Freq: Once | ORAL | Status: DC
  Filled 2024-10-04: qty 1

## 2024-10-04 MED ORDER — POLYETHYLENE GLYCOL 3350 17 G PO PACK
17.0000 g | PACK | Freq: Two times a day (BID) | ORAL | Status: DC | PRN
  Administered 2024-10-05: 17 g via ORAL
  Filled 2024-10-04 (×5): qty 1

## 2024-10-04 NOTE — Progress Notes (Signed)
 Patient appears comfortable in the chair. AAOx4. Denies pain and SOB. Room air. Last bowel movement 1/14. Dr. Sherre made aware. See new orders. Plan of care reviewed with patient.

## 2024-10-04 NOTE — Assessment & Plan Note (Signed)
 Suspect multifactorial with recent poor PO intake. Senna-docusate 1 tablet and glycolax  17 g once

## 2024-10-04 NOTE — Progress Notes (Addendum)
 "  PROGRESS NOTE - Telemedicine  Catherine Leon  FMW:[id number] DOB: [birth year] DOA: 10/01/2024 PCP: Clarice Nottingham, MD   [name] is an 84 year old female with history of hypertension, non-insulin -dependent diabetes mellitus, history of breast cancer, hyperlipidemia.  10/01/24: She presents to ED for chief concerns of cough, congestion, body aches, poor p.o. intake for approximately 2 weeks.  Vitals at the time of my evaluation showed Tmax of 101.9, respiration rate 19, heart rate 119, blood pressure 150/74, SpO2 100% on room air.  Serum sodium is 136, potassium 3.9, chloride 102, bicarb 21, BUN of 36, serum creatinine of 2.10, eGFR 23, nonfasting blood glucose 132, WBC 19.6, hemoglobin 11.8, platelets of 236.  Influenza A/influenza B/RSV/COVID PCR were negative.  Lactic acid 1.7.  ED treatment: Acetaminophen  650 mg p.o. one-time dose, doxycycline  100 mg p.o. one-time dose, ceftriaxone  1 g IV one-time dose, sodium chloride  1 L bolus, sodium chloride  700 mL liter bolus.  10/01/2024: I evaluated patient and completed history and physical via telemedicine encounter.  1/18: Patient care transferred to [facility] service.  1/19: I resumed care of patient. Day 4 of abx.  At bedside, patient states she is doing so much better than before.  She reports significant she has improved a lot since the day she was admitted to the hospital.  She reports he slept so much better last night when compared to sleeping in the hospital.  Patient reports no bowel movement since the 15th.  Senna docusate 1 tablet, miralax  17g packet once have been ordered.  Assessment & Plan:   Principal Problem:   Community acquired pneumonia Active Problems:   Left lower lobe pneumonia   Acute on chronic kidney disease stage 3   Leukocytosis   Hx of breast cancer   Nonischemic cardiomyopathy (HCC)   Diabetes mellitus type 2, noninsulin dependent (HCC)   Diarrhea   Cough   Hyperlipidemia   Essential hypertension    Constipated   Assessment and Plan:  * Community acquired pneumonia + SOB and malaise x 2 weeks on presentation  Left lower lobe pneumonia Continue with doxycycline  100 mg po BID and ceftriaxone  2 g IV daily to complete a minimum of 5-day course (day 3/5)  DuoNebs as needed for wheezing and shortness of breath Incentive spirometry, flutter valve 1/19 - day 4 of abx. Patient reports she is doing so much better than when she first got admitted to the hospital  Acute on chronic kidney disease stage 3 Initial serum Cr 2.1 with baseline Cr around 1.1-1.4 Baseline eGFR is 42 based on Dr. Coni note on 09/12/23 Initially felt to be prerenal  Cr now 1.4- near baseline  Continue to hold furosemide , and entresto , AM team to resume when benefits outweigh risk Continue to trend renal function  1/19 - resolved  Leukocytosis Suspect secondary to left lower lobe pneumonia however in setting of diarrhea with markedly elevated leukocytosis, C. difficile/infectious diarrhea cannot be excluded at this time Recheck CBC in the a.m.  1/19 - resolved  Constipated Suspect multifactorial with recent poor PO intake. Senna-docusate 1 tablet and glycolax  17 g once  Essential hypertension Home Entresto  and furosemide  not resumed on admission in setting of acute kidney injury Will add on low dose norvasc ( with parameters) as a bridge  Resume home regimen as clinically indicated   1/19 -renal function has resolved.  Home Farxiga  and Entresto  have been resumed for 10/05/2024  Hyperlipidemia Home rosuvastatin  10 mg daily resumed  Cough Robitussin DM as needed for  cough during the day and to loosen phlegm Tussionex nightly as needed for coughing at night Incentive spirometry, flutter valve  Diarrhea No diarrhea since admissions. Low clinical concern for infectious diarrhea at this time   Diabetes mellitus type 2, noninsulin dependent (HCC) A1C 6.6  Monitor blood sugars  BID blood sugar checks   Start glipizide vs long acting insulin  as clinically indicated   Nonischemic cardiomyopathy (HCC) Strict I's and O's Does not appear to be in acute exacerbation at this time  DVT prophylaxis: ambulation as tolerated  Code Status:  Family Communication:  Disposition Plan:  Level of care: Hospital at Home Med-Surg  Consultants:  PT, OT, TOC  Antimicrobials: 10/04/24: Day 4.  Subjective:  At bedside, patient was able to confirm her first last name, date of birth.  She reports she slept very well last night.  She reports she feels so much better than on day of admission.  She reports she is constipated, has not had a bowel movement since 12/15.  She is requesting medication and assistance with the constipation.  Objective: Vitals:   10/02/24 2102 10/03/24 0514 10/03/24 1413 10/04/24 0919  BP: (!) 111/56 109/64 125/70 (!) 140/70  Pulse: 82 76 81   Resp: 20 18 18    Temp: 99 F (37.2 C) 98.9 F (37.2 C) 97.6 F (36.4 C) 98.5 F (36.9 C)  TempSrc: Oral Oral Oral Oral  SpO2: 100% 100% 90%   Weight:   59.4 kg   Height:   5' 4.5 (1.638 m)     Intake/Output Summary (Last 24 hours) at 10/04/2024 1740 Last data filed at 10/04/2024 1200 Gross per 24 hour  Intake 200 ml  Output --  Net 200 ml   Filed Weights   10/01/24 1313 10/03/24 1413  Weight: 59.4 kg 59.4 kg   Examination was completed with the assistance of: Steffan Daring, paramedic, who was present in the house during this portion of the encounter:  General exam: Appears calm and comfortable  Respiratory system: Clear to auscultation. Respiratory effort normal. Cardiovascular system: S1 & S2 heard, RRR. No murmurs. No pedal edema. Gastrointestinal system: Abdomen is nondistended, soft and nontender. No organomegaly or masses felt. Normal bowel sounds heard. Central nervous system: Alert and oriented. No focal neurological deficits. Extremities: Symmetric 5 x 5 power. Skin: No rashes, lesions or ulcers Psychiatry:  Judgement and insight appear normal. Mood & affect appropriate.   Data Reviewed: I have personally reviewed following labs and imaging studies  CBC: Recent Labs  Lab 10/01/24 1322 10/02/24 0546 10/02/24 0821 10/03/24 0555 10/04/24 1138  WBC 19.6* 9.5 12.1* 8.2 10.0  NEUTROABS 18.0*  --  9.3* 5.9  --   HGB 11.8* 7.6* 10.8* 9.0* 10.2*  HCT 34.3* 22.6* 31.5* 26.6* 30.2*  MCV 79.0* 81.6 79.3* 79.4* 81.6  PLT 236 157 225 211 250   Basic Metabolic Panel: Recent Labs  Lab 10/01/24 1322 10/02/24 0546 10/02/24 0821 10/03/24 0555 10/04/24 1138  NA 136 139 141 140 141  K 3.9 4.0 3.9 3.6 3.8  CL 102 108 109 111 108  CO2 21* 16* 19* 21* 22  GLUCOSE 132* 63* 73 105* 191*  BUN 35* 22 28* 21 12  CREATININE 2.10* 1.38* 1.83* 1.47* 1.23*  CALCIUM  9.6 8.2* 9.2 8.7* 8.8*   GFR: Estimated Creatinine Clearance: 30 mL/min (A) (by C-G formula based on SCr of 1.23 mg/dL (H)).  Liver Function Tests: Recent Labs  Lab 10/01/24 1322 10/02/24 0821 10/04/24 1138  AST 47* 52* 33  ALT 38 36 32  ALKPHOS 118 101 110  BILITOT 1.3* 1.0 0.3  PROT 7.7 6.8 6.6  ALBUMIN 4.0 3.4* 3.4*   HbA1C: Recent Labs    10/02/24 0546  HGBA1C 6.6*   CBG: Recent Labs  Lab 10/02/24 1245 10/02/24 1658 10/02/24 2056 10/03/24 0746 10/03/24 1158  GLUCAP 91 143* 104* 91 82   Sepsis Labs: Recent Labs  Lab 10/01/24 1322  LATICACIDVEN 1.7   Recent Results (from the past 240 hours)  Resp panel by RT-PCR (RSV, Flu A&B, Covid) Anterior Nasal Swab     Status: None   Collection Time: 10/01/24  1:22 PM   Specimen: Anterior Nasal Swab  Result Value Ref Range Status   SARS Coronavirus 2 by RT PCR NEGATIVE NEGATIVE Final    Comment: (NOTE) SARS-CoV-2 target nucleic acids are NOT DETECTED.  The SARS-CoV-2 RNA is generally detectable in upper respiratory specimens during the acute phase of infection. The lowest concentration of SARS-CoV-2 viral copies this assay can detect is 138 copies/mL. A negative  result does not preclude SARS-Cov-2 infection and should not be used as the sole basis for treatment or other patient management decisions. A negative result may occur with  improper specimen collection/handling, submission of specimen other than nasopharyngeal swab, presence of viral mutation(s) within the areas targeted by this assay, and inadequate number of viral copies(<138 copies/mL). A negative result must be combined with clinical observations, patient history, and epidemiological information. The expected result is Negative.  Fact Sheet for Patients:  bloggercourse.com  Fact Sheet for Healthcare Providers:  seriousbroker.it  This test is no t yet approved or cleared by the [place]  FDA and  has been authorized for detection and/or diagnosis of SARS-CoV-2 by FDA under an Emergency Use Authorization (EUA). This EUA will remain  in effect (meaning this test can be used) for the duration of the COVID-19 declaration under Section 564(b)(1) of the Act, 21 U.S.C.section 360bbb-3(b)(1), unless the authorization is terminated  or revoked sooner.       Influenza A by PCR NEGATIVE NEGATIVE Final   Influenza B by PCR NEGATIVE NEGATIVE Final    Comment: (NOTE) The Xpert Xpress SARS-CoV-2/FLU/RSV plus assay is intended as an aid in the diagnosis of influenza from Nasopharyngeal swab specimens and should not be used as a sole basis for treatment. Nasal washings and aspirates are unacceptable for Xpert Xpress SARS-CoV-2/FLU/RSV testing.  Fact Sheet for Patients: bloggercourse.com  Fact Sheet for Healthcare Providers: seriousbroker.it  This test is not yet approved or cleared by the [place]  FDA and has been authorized for detection and/or diagnosis of SARS-CoV-2 by FDA under an Emergency Use Authorization (EUA). This EUA will remain in effect (meaning this test can be used)  for the duration of the COVID-19 declaration under Section 564(b)(1) of the Act, 21 U.S.C. section 360bbb-3(b)(1), unless the authorization is terminated or revoked.     Resp Syncytial Virus by PCR NEGATIVE NEGATIVE Final    Comment: (NOTE) Fact Sheet for Patients: bloggercourse.com  Fact Sheet for Healthcare Providers: seriousbroker.it  This test is not yet approved or cleared by the [place]  FDA and has been authorized for detection and/or diagnosis of SARS-CoV-2 by FDA under an Emergency Use Authorization (EUA). This EUA will remain in effect (meaning this test can be used) for the duration of the COVID-19 declaration under Section 564(b)(1) of the Act, 21 U.S.C. section 360bbb-3(b)(1), unless the authorization is terminated or revoked.  Performed at [facility], [address]  [place], [place] [address]     Scheduled Meds:  [START ON 10/05/2024] dapagliflozin  propanediol  10 mg Oral Daily   doxycycline   100 mg Oral Q12H   polyethylene glycol  17 g Oral Once   rosuvastatin   10 mg Oral Daily   [START ON 10/05/2024] sacubitril -valsartan   1 tablet Oral BID   senna-docusate  1 tablet Oral Once   senna-docusate  1 tablet Oral BID   Continuous Infusions:  cefTRIAXone  (ROCEPHIN )  IV Stopped (10/04/24 1021)    LOS: 3 days   Time spent: 52 minutes  Dr. Sherre Location: [place]  [facility] If 7PM-7AM, please contact night-coverage 10/04/2024, 5:40 PM  "

## 2024-10-04 NOTE — Progress Notes (Signed)
 Arrived to find the patient sitting on her recliner in no obvious distress. Pt stated that she felt fine and no issues with breathing at this time. Vitals and lung sounds were assessed. Pt stated that she still hasn't had a bowel movement since last week and that she has ate more food today than the past few days. Medications were given with nurse Shanda. IMM form was signed at 1752, copy was left with patient and another was placed in the pt chart.[name]

## 2024-10-04 NOTE — Progress Notes (Addendum)
 Phone call completed with patient. Introductions made and patient aware that RN is available all night via phone or tablet call if needed. Patient states that she was sleeping. She denies pain but still endorses cough which she states is getting better. Plan made for patient to call RN via tablet when she is ready to take her bedtime medications, which will included neb treatment and cough syrup. No other needs at this time.

## 2024-10-04 NOTE — Progress Notes (Signed)
 Medic arrived to the home. Patient was at the door and walked to her living room to star tthe visit.   Patient stated she did not have any complaints and felt a lot better than when she was first admitted. Patient had not had a bowel movement since Wednesday, stated previously she had a lot of loose stool. Her appetite has been dimished as well.   IV site examined and medications administered per [facility].   Radial pulses equal, lung sounds equal bilaterally. Pt states she has a productive cough but has been improving. Equipment unable to obtain SPO2, cap refill less than 3 seconds. PT self reported CBG was 73 1 hour prior to arrival. Pt stated she drank some juice and [facility] equipment read 132.   Virtual visit completed with Virtual RN and MD. MD instructed pt to use nebulizer, Medic walked patient through operating the nebulizer.   Labs drawn and [employer] left the home to return to [employer].

## 2024-10-04 NOTE — Plan of Care (Signed)

## 2024-10-04 NOTE — Plan of Care (Signed)
" °  Problem: Nutritional: Goal: Maintenance of adequate nutrition will improve Outcome: Progressing   Problem: Education: Goal: Knowledge of General Education information will improve Description: Including pain rating scale, medication(s)/side effects and non-pharmacologic comfort measures Outcome: Progressing   Problem: Clinical Measurements: Goal: Ability to maintain clinical measurements within normal limits will improve Outcome: Progressing Goal: Respiratory complications will improve Outcome: Progressing   Problem: Nutrition: Goal: Adequate nutrition will be maintained Outcome: Progressing   Problem: Activity: Goal: Ability to tolerate increased activity will improve Outcome: Progressing   Problem: Respiratory: Goal: Ability to maintain adequate ventilation will improve Outcome: Progressing Goal: Ability to maintain a clear airway will improve Outcome: Progressing   "

## 2024-10-05 ENCOUNTER — Inpatient Hospital Stay (HOSPITAL_COMMUNITY)

## 2024-10-05 ENCOUNTER — Other Ambulatory Visit (HOSPITAL_COMMUNITY): Payer: Self-pay

## 2024-10-05 DIAGNOSIS — M7989 Other specified soft tissue disorders: Secondary | ICD-10-CM

## 2024-10-05 MED ORDER — ONDANSETRON HCL 4 MG PO TABS
4.0000 mg | ORAL_TABLET | Freq: Four times a day (QID) | ORAL | 0 refills | Status: AC | PRN
  Filled 2024-10-05: qty 20, 18d supply, fill #0
  Filled 2024-10-05: qty 20, 5d supply, fill #0

## 2024-10-05 MED ORDER — GUAIFENESIN-DM 100-10 MG/5ML PO SYRP
5.0000 mL | ORAL_SOLUTION | ORAL | 0 refills | Status: AC | PRN
  Filled 2024-10-05 (×3): qty 118, 4d supply, fill #0

## 2024-10-05 MED ORDER — FUROSEMIDE 20 MG PO TABS
20.0000 mg | ORAL_TABLET | Freq: Two times a day (BID) | ORAL | Status: AC
  Administered 2024-10-05: 20 mg via ORAL
  Filled 2024-10-05 (×2): qty 1

## 2024-10-05 MED ORDER — DOXYCYCLINE HYCLATE 100 MG PO TABS
100.0000 mg | ORAL_TABLET | Freq: Two times a day (BID) | ORAL | 0 refills | Status: AC
  Filled 2024-10-05: qty 2, 1d supply, fill #0

## 2024-10-05 MED ORDER — IPRATROPIUM-ALBUTEROL 0.5-2.5 (3) MG/3ML IN SOLN
3.0000 mL | Freq: Four times a day (QID) | RESPIRATORY_TRACT | 1 refills | Status: AC | PRN
  Filled 2024-10-05: qty 60, 5d supply, fill #0
  Filled 2024-10-05: qty 90, 8d supply, fill #0

## 2024-10-05 MED ORDER — POLYETHYLENE GLYCOL 3350 17 GM/SCOOP PO POWD
17.0000 g | Freq: Two times a day (BID) | ORAL | 0 refills | Status: AC | PRN
  Filled 2024-10-05 (×3): qty 238, 7d supply, fill #0

## 2024-10-05 MED ORDER — SENNOSIDES-DOCUSATE SODIUM 8.6-50 MG PO TABS
1.0000 | ORAL_TABLET | Freq: Two times a day (BID) | ORAL | 0 refills | Status: AC
  Filled 2024-10-05 (×3): qty 14, 7d supply, fill #0

## 2024-10-05 MED ORDER — HYDROCOD POLI-CHLORPHE POLI ER 10-8 MG/5ML PO SUER
5.0000 mL | Freq: Every evening | ORAL | 0 refills | Status: AC | PRN
  Filled 2024-10-05: qty 25, 5d supply, fill #0

## 2024-10-05 MED ORDER — LINAGLIPTIN 5 MG PO TABS
5.0000 mg | ORAL_TABLET | Freq: Every day | ORAL | Status: DC
  Administered 2024-10-05: 5 mg via ORAL
  Filled 2024-10-05 (×2): qty 1

## 2024-10-05 NOTE — Progress Notes (Signed)
 Pt seen for routine [facility] morning visit. Pt appears well and states she slept well and greeted me at the door. Pt also reports appetite is starting to improve and she is trying to drink more water. She states she had some oatmeal this morning. Pt denies any pain but complains of new onset of bilateral leg swelling upon waking this morning.   Vital signs and assessment obtained as noted. L/S are clear in upper fields and clear/ diminished in lower fields. Pt reports somewhat productive cough with clear thick sputum. Pt denies any dyspnea with ambulation. Heart tones are s1,s2 with strong regular pulses. Bilateral, non-pitting, lower leg edema noted and photos of same taken and uploaded to media tab. Pt's abdomen is soft and non-tender with active bowel sounds. Pt's last bm was 09/29/24. Pt states she feels constipation may be related to having a lot of diarrhea at beginning of original hospital admission and then poor appetite as she hasn't been feeling well. Medications administered for same. Pt denies any dysuria. Pt's weight on 10/03/24 was 59.4 kg and today it was 64.411 kg.   Pt had virtual visit with Shanda, RN and Dr. Sherre. Due to new onset of bilateral lower leg edema, discharge will be put on hold and we will transport Pt to [facility] for vascular [place]/[facility]. Nat, EMT delivered AVS and discharge medications. Nebulizer machine arrived at Pt's home from DME company while I was present as well.   IV flushed prior to administering Rocephin . Rocephin  administered without incident and I flushed again. After a few minutes the Pt noticed some leakage at site and I noticed signs of possible infiltration. Pt denies any pain. IV removed and documented under LDA avatar.   Pt transported via wheelchair fleeta by Nat, EMT to [facility] for vascular [place]

## 2024-10-05 NOTE — Progress Notes (Signed)
 Venous duplex lower ext  has been completed. Refer to [facility] under chart review to view preliminary results.   10/05/2024  1:49 PM Emelie Newsom, Ricka BIRCH

## 2024-10-05 NOTE — TOC Transition Note (Signed)
 Transition of Care [facility]) - Discharge Note   Patient Details  Name: Catherine Leon MRN: [id number] Date of Birth: [birth year]  [facility]) CM/SW Contact:  Debarah Saunas, RN Phone Number: 10/05/2024, 8:32 AM   Clinical Narrative:       Final next level of care: Home/Self Care Barriers to Discharge: Barriers Resolved   Patient Goals and CMS Choice Patient states their goals for this hospitalization and ongoing recovery are:: keep getting better to return to [employer]ball                                                                  Social Drivers of Health (SDOH) Interventions SDOH Screenings   Tobacco Use: Medium Risk (10/01/2024)     Readmission Risk Interventions    10/05/2024    8:31 AM  Readmission Risk Prevention Plan  Post Dischage Appt Complete  Medication Screening Complete  Transportation Screening Complete

## 2024-10-05 NOTE — Discharge Summary (Signed)
 "  Physician Discharge Summary - Telemedicine Encounter   Patient: Catherine Leon MRN: [id number] DOB: 14-Apr-1941  Admit date:     10/01/2024  Discharge date: 10/05/24  Discharge Physician: Dr. Sherre   PCP: Catherine Nottingham, MD   At bedside, via telemedicine encounter, patient was able to confirm their first and last name and date of birth.  Physical exam was completed with assistance of Catherine Leon, paramedic, who was present in the home during this portion of the encounter.   Recommendations at discharge:   Complete 5 day course of antibiotics: doxycycline  100 mg, twice daily. Take your first home dose evening of 10/05/24 and the final dose AM of 10/06/24. Home medications have been resumed. Duoneb solutions, as needed cough medications have been prescribed, refills will need to be requested with your primary care provider. I recommend you follow-up with your primary care provider within 2-4 weeks of discharge.  Take an extra furosemide  20 mg this afternoon. Tomorrow (1/21), please take furosemide  20 mg PO, twice per day. Today and tomorrow monitor your fluid intake to limit only 60 oz of water/fluid. Resume regular furosemide  dosing on 1/22.  Monitor your weight and leg swelling daily. If you gain 2.5+ lbs per day (obtain by weighing yourself in the morning after waking up and urinating), take an extra furosemide  20 mg by mouth (two doses that day, one in the AM and one in the afternoon). If your weight does not resume back to baseline after 3 days of treatment, contact your primary care provider for further instructions.  Discharge Diagnoses: Principal Problem:   Community acquired pneumonia Active Problems:   Left lower lobe pneumonia   Acute on chronic kidney disease stage 3   Leukocytosis   Hx of breast cancer   Nonischemic cardiomyopathy (HCC)   Diabetes mellitus type 2, noninsulin dependent (HCC)   Diarrhea   Cough   Hyperlipidemia   Essential hypertension   Constipated   Leg  swelling  Resolved Problems:   * No resolved hospital problems. *  Hospital Course:  [name] is an 84 year old female with history of hypertension, non-insulin -dependent diabetes mellitus, history of breast cancer, hyperlipidemia.  10/01/24: She presents to ED for chief concerns of cough, congestion, body aches, poor p.o. intake for approximately 2 weeks.  Vitals at the time of my evaluation showed Tmax of 101.9, respiration rate 19, heart rate 119, blood pressure 150/74, SpO2 100% on room air.  Serum sodium is 136, potassium 3.9, chloride 102, bicarb 21, BUN of 36, serum creatinine of 2.10, eGFR 23, nonfasting blood glucose 132, WBC 19.6, hemoglobin 11.8, platelets of 236.  Influenza A/influenza B/RSV/COVID PCR were negative.  Lactic acid 1.7.  ED treatment: Acetaminophen  650 mg p.o. one-time dose, doxycycline  100 mg p.o. one-time dose, ceftriaxone  1 g IV one-time dose, sodium chloride  1 L bolus, sodium chloride  700 mL liter bolus.  10/01/2024: I evaluated patient and completed history and physical via telemedicine encounter.  1/18: Patient care transferred to [facility] service.  1/19: I resumed care of patient. Day 4 of abx.  At bedside, patient states she is doing so much better than before.  She reports significant she has improved a lot since the day she was admitted to the hospital.  She reports he slept so much better last night when compared to sleeping in the hospital.  Patient reports no bowel movement since the 15th.  Senna docusate 1 tablet, miralax  17g packet once have been ordered.  Assessment and Plan:  *  Community acquired pneumonia + SOB and malaise x 2 weeks on presentation  Left lower lobe pneumonia Continue with doxycycline  100 mg po BID and ceftriaxone  2 g IV daily to complete a minimum of 5-day course (day 3/5)  DuoNebs as needed for wheezing and shortness of breath Incentive spirometry, flutter valve 1/19 - day 4 of abx. Patient reports she is doing so much  better than when she first got admitted to the hospital 1/20: Completed day 5 of antibiotic.  Acute on chronic kidney disease stage 3 Initial serum Cr 2.1 with baseline Cr around 1.1-1.4 Baseline eGFR is 42 based on Catherine Leon note on 09/12/23 Initially felt to be prerenal  Cr now 1.4- near baseline  Continue to hold furosemide , and entresto , AM team to resume when benefits outweigh risk Continue to trend renal function  1/19 - resolved  Leukocytosis Suspect secondary to left lower lobe pneumonia however in setting of diarrhea with markedly elevated leukocytosis, C. difficile/infectious diarrhea cannot be excluded at this time Recheck CBC in the a.m.  1/19 - resolved  Constipated Suspect multifactorial with recent poor PO intake. Senna-docusate 1 tablet and glycolax  17 g once  Essential hypertension Home Entresto  and furosemide  not resumed on admission in setting of acute kidney injury Will add on low dose norvasc ( with parameters) as a bridge  Resume home regimen as clinically indicated   1/19 -renal function has resolved.  Home Farxiga  and Entresto  have been resumed for 10/05/2024  Hyperlipidemia Home rosuvastatin  10 mg daily resumed  Cough Robitussin DM as needed for cough during the day and to loosen phlegm Tussionex nightly as needed for coughing at night Incentive spirometry, flutter valve  Diarrhea No diarrhea since admissions. Low clinical concern for infectious diarrhea at this time   Diabetes mellitus type 2, noninsulin dependent (HCC) A1C 6.6  Monitor blood sugars  BID blood sugar checks  Start glipizide vs long acting insulin  as clinically indicated  Home Tradjenta  resumed on discharge  Nonischemic cardiomyopathy (HCC) Strict I's and O's Does not appear to be in acute exacerbation at this time  Leg swelling Bilateral and negative for leg tenderness Patient has put on a little bit of weight over the last few days I suspect this is secondary to  holding her home furosemide , Farxiga  Her renal function has resumed to baseline, I have resumed furosemide  and Farxiga  on discharge Extra home Farxiga  20 mg p.o. one-time dose ordered for now.  Nursing is aware. Ultrasound of the lower extremity done to assess for DVT-bilateral lower extremity ultrasound was negative for DVT Instructions for patient to take another furosemide  20 mg p.o. today in the ED evening and 2 doses tomorrow.  Patient to monitor her dry weight every morning and if she has an increase of 2.5 pounds, to take an extra dose of furosemide  20 mg p.o. that day.  If she does this for 3 days in a row and her weight does not go down and/or if she continues to have lower extremity edema, patient has been advised to call PCP for further management and recommendation     Pain control - [place]  Controlled Substance Reporting System database was reviewed. and patient was instructed, not to drive, operate heavy machinery, perform activities at heights, swimming or participation in water activities or provide baby-sitting services while on Pain, Sleep and Anxiety Medications; until their outpatient Physician has advised to do so again. Also recommended to not to take more than prescribed Pain, Sleep and Anxiety Medications.  Consultants: pt, ot, toc, pharmacy Procedures performed: none Disposition: Home Diet recommendation:  Discharge Diet Orders (From admission, onward)     Start     Ordered   10/05/24 0000  Diet - low sodium heart healthy        10/05/24 0755   10/05/24 0000  Diet Carb Modified        10/05/24 0755           Cardiac diet  DISCHARGE MEDICATION: Allergies as of 10/05/2024       Reactions   Dapagliflozin  Pro-metformin Er    Other reaction(s): vertigo        Medication List     TAKE these medications    chlorpheniramine-HYDROcodone  10-8 MG/5ML Commonly known as: TUSSIONEX Take 5 mLs by mouth at bedtime as needed for up to 5 days for cough.  J18.9, R05.1, R05.2   doxycycline  100 MG tablet Commonly known as: VIBRA -TABS Take 1 tablet (100 mg total) by mouth every 12 (twelve) hours for 2 doses. Take 1 dose 10/05/24 evening and the last dose AM of 10/06/24 to complete 5 day course.   Farxiga  10 MG Tabs tablet Generic drug: dapagliflozin  propanediol Take 10 mg by mouth daily.   furosemide  20 MG tablet Commonly known as: LASIX  Take 1 tablet (20 mg total) by mouth daily. Notes to patient: Per Dr. Sherre: Takes 20mg  on 1/20 during the evening. Tomorrow (1/21), please take furosemide  20 mg PO, twice per day. On 1/22 resume you normal lasix  dose.   guaiFENesin -dextromethorphan  100-10 MG/5ML syrup Commonly known as: ROBITUSSIN DM Take 5 mLs by mouth every 4 (four) hours as needed (cough during day and/or loosen phelgm).   ipratropium-albuterol  0.5-2.5 (3) MG/3ML Soln Commonly known as: DUONEB Take 3 mLs by nebulization every 6 (six) hours as needed.   ondansetron  4 MG tablet Commonly known as: ZOFRAN  Take 1 tablet (4 mg total) by mouth every 6 (six) hours as needed for nausea.   polyethylene glycol powder 17 GM/SCOOP powder Commonly known as: GLYCOLAX /MIRALAX  Take 17 g by mouth 2 (two) times daily as needed for moderate constipation or mild constipation. Dissolve 1 capful (17g) in 4-8 ounces of liquid and take by mouth daily.   potassium chloride  SA 20 MEQ tablet Commonly known as: Klor-Con  M20 Take 1 tablet (20 mEq total) by mouth daily.   rosuvastatin  10 MG tablet Commonly known as: CRESTOR  Take 1 tablet (10 mg total) by mouth daily.   sacubitril -valsartan  24-26 MG Commonly known as: Entresto  Take 1 tablet by mouth 2 (two) times daily.   Stool Softener/Laxative 50-8.6 MG tablet Generic drug: senna-docusate Take 1 tablet by mouth 2 (two) times daily for 7 days.   Tradjenta  5 MG Tabs tablet Generic drug: linagliptin  Take 5 mg by mouth daily.               Durable Medical Equipment  (From admission, onward)            Start     Ordered   10/05/24 0811  For home use only DME Nebulizer machine  Once       Question Answer Comment  Patient needs a nebulizer to treat with the following condition Bronchitis   Patient needs a nebulizer to treat with the following condition Cough due to bronchospasm   Patient needs a nebulizer to treat with the following condition Left lower lobe pneumonia   Length of Need 6 Months   Additional equipment included Administration kit   Additional equipment included Filter  10/05/24 9188            Follow-up Information     Catherine Nottingham, MD Follow up on 10/21/2024.   Specialty: Internal Medicine Why: Time 1030, Hospital Follow-up.  Please call if you need to change appointment *you will see [facility] information: [address] [place] [address] [phone]                 Discharge Exam: Filed Weights   10/01/24 1313 10/03/24 1413 10/05/24 1000  Weight: 59.4 kg 59.4 kg 64.4 kg   Physical Exam Constitutional:      Appearance: Normal appearance.  HENT:     Head: Normocephalic and atraumatic.     Right Ear: External ear normal.     Left Ear: External ear normal.     Nose: Nose normal.  Eyes:     Extraocular Movements: Extraocular movements intact.     Conjunctiva/sclera: Conjunctivae normal.  Neck:     Comments: And midline Cardiovascular:     Rate and Rhythm: Normal rate and regular rhythm.     Heart sounds: Normal heart sounds.  Abdominal:     General: Bowel sounds are normal. There is no distension.     Palpations: Abdomen is soft.     Tenderness: There is no abdominal tenderness.  Musculoskeletal:        General: Normal range of motion.     Cervical back: Normal range of motion.     Right lower leg: Edema present.     Left lower leg: Edema present.  Skin:    Coloration: Skin is not jaundiced or pale.  Neurological:     Mental Status: She is alert and oriented to person, place, and time.  Mental status is at baseline.  Psychiatric:        Mood and Affect: Mood normal.        Behavior: Behavior normal.   Condition at discharge: good  The results of significant diagnostics from this hospitalization (including imaging, microbiology, ancillary and laboratory) are listed below for reference.   Imaging Studies: VAS US  LOWER EXTREMITY VENOUS (DVT) Result Date: 10/05/2024  Lower Venous DVT Study Patient Name:  SIERRIA BRUNEY  Date of Exam:   10/05/2024 Medical Rec #: [id number]      Accession #:    [id number] Date of Birth: 20-Nov-1940     Patient Gender: F Patient Age:   5 years Exam Location:  [facility] Procedure:      VAS US  LOWER EXTREMITY VENOUS (DVT) Referring Phys: Woodie Trusty --------------------------------------------------------------------------------  Indications: Edema, and Swelling.  Comparison Study: No priors. Performing Technologist: Ricka Sturdivant-Jones RDMS, RVT  Examination Guidelines: A complete evaluation includes B-mode imaging, spectral Doppler, color Doppler, and power Doppler as needed of all accessible portions of each vessel. Bilateral testing is considered an integral part of a complete examination. Limited examinations for reoccurring indications may be performed as noted. The reflux portion of the exam is performed with the patient in reverse Trendelenburg.  +---------+---------------+---------+-----------+----------+--------------+ RIGHT    CompressibilityPhasicitySpontaneityPropertiesThrombus Aging +---------+---------------+---------+-----------+----------+--------------+ CFV      Full           Yes      Yes                                 +---------+---------------+---------+-----------+----------+--------------+ SFJ      Full                                                        +---------+---------------+---------+-----------+----------+--------------+  FV Prox  Full                                                         +---------+---------------+---------+-----------+----------+--------------+ FV Mid   Full           Yes      Yes                                 +---------+---------------+---------+-----------+----------+--------------+ FV DistalFull                                                        +---------+---------------+---------+-----------+----------+--------------+ PFV      Full                                                        +---------+---------------+---------+-----------+----------+--------------+ POP      Full           Yes      Yes                                 +---------+---------------+---------+-----------+----------+--------------+ PTV      Full                                                        +---------+---------------+---------+-----------+----------+--------------+ PERO     Full                                                        +---------+---------------+---------+-----------+----------+--------------+   +---------+---------------+---------+-----------+----------+--------------+ LEFT     CompressibilityPhasicitySpontaneityPropertiesThrombus Aging +---------+---------------+---------+-----------+----------+--------------+ CFV      Full           Yes      Yes                                 +---------+---------------+---------+-----------+----------+--------------+ SFJ      Full                                                        +---------+---------------+---------+-----------+----------+--------------+ FV Prox  Full                                                        +---------+---------------+---------+-----------+----------+--------------+  FV Mid   Full           Yes      Yes                                 +---------+---------------+---------+-----------+----------+--------------+ FV DistalFull                                                         +---------+---------------+---------+-----------+----------+--------------+ PFV      Full                                                        +---------+---------------+---------+-----------+----------+--------------+ POP      Full           Yes      Yes                                 +---------+---------------+---------+-----------+----------+--------------+ PTV      Full                                                        +---------+---------------+---------+-----------+----------+--------------+ PERO     Full                                                        +---------+---------------+---------+-----------+----------+--------------+     Summary: BILATERAL: - No evidence of deep vein thrombosis seen in the lower extremities, bilaterally. -No evidence of popliteal cyst, bilaterally.   *See table(s) above for measurements and observations. Electronically signed by Gaile New MD on 10/05/2024 at 3:16:44 PM.    Final    DG Chest 2 View Result Date: 10/01/2024 CLINICAL DATA:  Cough EXAM: CHEST - 2 VIEW COMPARISON:  April 18, 2022 FINDINGS: Stable cardiomediastinal silhouette. Mildly increased left basilar opacity is noted concerning for pneumonia or atelectasis. Right lung is unremarkable. Bony thorax is unremarkable. IMPRESSION: Mildly increased left basilar opacity is noted concerning for pneumonia or atelectasis. Electronically Signed   By: Lynwood Landy Raddle M.D.   On: 10/01/2024 14:19    Microbiology: Results for orders placed or performed during the hospital encounter of 10/01/24  Resp panel by RT-PCR (RSV, Flu A&B, Covid) Anterior Nasal Swab     Status: None   Collection Time: 10/01/24  1:22 PM   Specimen: Anterior Nasal Swab  Result Value Ref Range Status   SARS Coronavirus 2 by RT PCR NEGATIVE NEGATIVE Final    Comment: (NOTE) SARS-CoV-2 target nucleic acids are NOT DETECTED.  The SARS-CoV-2 RNA is generally detectable in upper respiratory specimens  during the acute phase of infection. The lowest concentration of SARS-CoV-2 viral copies this assay can detect is 138 copies/mL. A negative result does not preclude SARS-Cov-2  infection and should not be used as the sole basis for treatment or other patient management decisions. A negative result may occur with  improper specimen collection/handling, submission of specimen other than nasopharyngeal swab, presence of viral mutation(s) within the areas targeted by this assay, and inadequate number of viral copies(<138 copies/mL). A negative result must be combined with clinical observations, patient history, and epidemiological information. The expected result is Negative.  Fact Sheet for Patients:  bloggercourse.com  Fact Sheet for Healthcare Providers:  seriousbroker.it  This test is no t yet approved or cleared by the [place]  FDA and  has been authorized for detection and/or diagnosis of SARS-CoV-2 by FDA under an Emergency Use Authorization (EUA). This EUA will remain  in effect (meaning this test can be used) for the duration of the COVID-19 declaration under Section 564(b)(1) of the Act, 21 U.S.C.section 360bbb-3(b)(1), unless the authorization is terminated  or revoked sooner.       Influenza A by PCR NEGATIVE NEGATIVE Final   Influenza B by PCR NEGATIVE NEGATIVE Final    Comment: (NOTE) The Xpert Xpress SARS-CoV-2/FLU/RSV plus assay is intended as an aid in the diagnosis of influenza from Nasopharyngeal swab specimens and should not be used as a sole basis for treatment. Nasal washings and aspirates are unacceptable for Xpert Xpress SARS-CoV-2/FLU/RSV testing.  Fact Sheet for Patients: bloggercourse.com  Fact Sheet for Healthcare Providers: seriousbroker.it  This test is not yet approved or cleared by the [place]  FDA and has been authorized for detection  and/or diagnosis of SARS-CoV-2 by FDA under an Emergency Use Authorization (EUA). This EUA will remain in effect (meaning this test can be used) for the duration of the COVID-19 declaration under Section 564(b)(1) of the Act, 21 U.S.C. section 360bbb-3(b)(1), unless the authorization is terminated or revoked.     Resp Syncytial Virus by PCR NEGATIVE NEGATIVE Final    Comment: (NOTE) Fact Sheet for Patients: bloggercourse.com  Fact Sheet for Healthcare Providers: seriousbroker.it  This test is not yet approved or cleared by the [place]  FDA and has been authorized for detection and/or diagnosis of SARS-CoV-2 by FDA under an Emergency Use Authorization (EUA). This EUA will remain in effect (meaning this test can be used) for the duration of the COVID-19 declaration under Section 564(b)(1) of the Act, 21 U.S.C. section 360bbb-3(b)(1), unless the authorization is terminated or revoked.  Performed at [facility], [address] [place], [place] [address]     Labs: CBC: Recent Labs  Lab 10/01/24 1322 10/02/24 0546 10/02/24 0821 10/03/24 0555 10/04/24 1138  WBC 19.6* 9.5 12.1* 8.2 10.0  NEUTROABS 18.0*  --  9.3* 5.9  --   HGB 11.8* 7.6* 10.8* 9.0* 10.2*  HCT 34.3* 22.6* 31.5* 26.6* 30.2*  MCV 79.0* 81.6 79.3* 79.4* 81.6  PLT 236 157 225 211 250   Basic Metabolic Panel: Recent Labs  Lab 10/01/24 1322 10/02/24 0546 10/02/24 0821 10/03/24 0555 10/04/24 1138  NA 136 139 141 140 141  K 3.9 4.0 3.9 3.6 3.8  CL 102 108 109 111 108  CO2 21* 16* 19* 21* 22  GLUCOSE 132* 63* 73 105* 191*  BUN 35* 22 28* 21 12  CREATININE 2.10* 1.38* 1.83* 1.47* 1.23*  CALCIUM  9.6 8.2* 9.2 8.7* 8.8*   Liver Function Tests: Recent Labs  Lab 10/01/24 1322 10/02/24 0821 10/04/24 1138  AST 47* 52* 33  ALT 38 36 32  ALKPHOS 118 101 110  BILITOT 1.3* 1.0 0.3  PROT 7.7  6.8 6.6  ALBUMIN 4.0 3.4* 3.4*   CBG: Recent  Labs  Lab 10/02/24 1245 10/02/24 1658 10/02/24 2056 10/03/24 0746 10/03/24 1158  GLUCAP 91 143* 104* 91 82   Discharge time spent: greater than 30 minutes.  Signed: Dr. Sherre [facility] Location: [place]  10/05/2024  "

## 2024-10-05 NOTE — Assessment & Plan Note (Signed)
 Bilateral and negative for leg tenderness Patient has put on a little bit of weight over the last few days I suspect this is secondary to holding her home furosemide , Farxiga  Her renal function has resumed to baseline, I have resumed furosemide  and Farxiga  on discharge Extra home Farxiga  20 mg p.o. one-time dose ordered for now.  Nursing is aware. Ultrasound of the lower extremity done to assess for DVT-bilateral lower extremity ultrasound was negative for DVT Instructions for patient to take another furosemide  20 mg p.o. today in the ED evening and 2 doses tomorrow.  Patient to monitor her dry weight every morning and if she has an increase of 2.5 pounds, to take an extra dose of furosemide  20 mg p.o. that day.  If she does this for 3 days in a row and her weight does not go down and/or if she continues to have lower extremity edema, patient has been advised to call PCP for further management and recommendation

## 2024-10-05 NOTE — Progress Notes (Signed)
 This clinical research associate went to [facility] pharmacy to pick up patients discharge meds and obtained the patients AVS. This clinical research associate went to the patients house, Paramedic Lauraine was in the home finishing up her visit. The patient woke up with edema in bilateral legs. Dr. Sherre ordered the patient to have bilateral ultra sound to rule out DVT's. This clinical research associate transported the patient to [facility] department.   The patient arrived at [facility] without any problems. The staff checked patient in and came out to get the patient for the procedure. The patient went back to exam room, patient undressed from the waist down. The tech performed the ultra sound, she stated it was negative for DVT's. This clinical research associate helped the patient get dressed, called Acupuncturist, Shanda to let her know what the tech said and to ask if the patient could be transported back? This clinical research associate transported the patient home. The patient had no issues. This clinical research associate started the discharge process with the patient. This clinical research associate called the Acupuncturist, Shanda to go over the patients AVS with her. This clinical research associate gathered the [facility] equipment. This clinical research associate answered the questions the patient had and left the patients home. ------------------------------------------------------------------------------------------------------------------------ Catherine Leon. Franchot, EMT

## 2024-10-05 NOTE — Plan of Care (Signed)

## 2024-10-05 NOTE — Discharge Planning (Signed)
 Due to the swelling to her b/l lower extremities, U/S ordered to rule out any DVT. D/C cancelled for now.  RNCM following for discharge needs. Kanoa Phillippi J. Debarah, BSN,  RN, [facility] [phone]]

## 2024-10-05 NOTE — Progress Notes (Signed)
 Communicated with patient via video tablet. Patient appears comfortable in the chair. AVS paperwork given to the patient. Discharge instructions discussed with patient. Patient's question answered to satisfaction. Patient agreeable with discharge plan.

## 2024-10-05 NOTE — Discharge Planning (Signed)
 Pt qualifies for DME (Durable Medical Equipment) nebulizer.  DME ordered through [employer].  [place], liaison of [employer] notified to deliver DME to pt home.  DME agency information placed on After Visit Summary (AVS) for pt to refer to with any DME questions after discharge.

## 2024-10-05 NOTE — Progress Notes (Signed)
 Patient reports new b/l lower extremity swelling. Pictures obtained and uploaded to her chart. Dr. Sherre made aware. Ultrasound of the lower extremities ordered. Discharge canceled as of now.
